# Patient Record
Sex: Male | Born: 1980 | Race: White | Hispanic: No | Marital: Married | Smoking: Never smoker
Health system: Southern US, Community
[De-identification: ages and names within clinical notes are randomized; demographics above are authoritative.]

## PROBLEM LIST (undated history)

## (undated) DIAGNOSIS — F419 Anxiety disorder, unspecified: Secondary | ICD-10-CM

## (undated) DIAGNOSIS — G4730 Sleep apnea, unspecified: Secondary | ICD-10-CM

## (undated) HISTORY — DX: Anxiety disorder, unspecified: F41.9

## (undated) HISTORY — DX: Morbid (severe) obesity due to excess calories: E66.01

## (undated) HISTORY — PX: VASECTOMY: SHX75

## (undated) HISTORY — PX: HERNIA REPAIR: SHX51

## (undated) HISTORY — DX: Sleep apnea, unspecified: G47.30

---

## 2007-03-16 ENCOUNTER — Ambulatory Visit: Payer: Self-pay

## 2007-05-30 ENCOUNTER — Emergency Department: Payer: Self-pay | Admitting: Emergency Medicine

## 2007-11-03 ENCOUNTER — Ambulatory Visit: Payer: Self-pay | Admitting: Family Medicine

## 2007-11-05 ENCOUNTER — Emergency Department: Payer: Self-pay | Admitting: Emergency Medicine

## 2009-03-20 DIAGNOSIS — Z800 Family history of malignant neoplasm of digestive organs: Secondary | ICD-10-CM | POA: Insufficient documentation

## 2010-05-23 DIAGNOSIS — F419 Anxiety disorder, unspecified: Secondary | ICD-10-CM | POA: Insufficient documentation

## 2012-03-11 ENCOUNTER — Ambulatory Visit: Payer: Self-pay | Admitting: Family Medicine

## 2012-03-11 LAB — RAPID INFLUENZA A&B ANTIGENS

## 2012-03-11 LAB — RAPID STREP-A WITH REFLX: Micro Text Report: POSITIVE

## 2014-04-22 DIAGNOSIS — I499 Cardiac arrhythmia, unspecified: Secondary | ICD-10-CM | POA: Insufficient documentation

## 2015-07-26 DIAGNOSIS — R6882 Decreased libido: Secondary | ICD-10-CM | POA: Insufficient documentation

## 2015-07-26 DIAGNOSIS — R002 Palpitations: Secondary | ICD-10-CM | POA: Insufficient documentation

## 2015-07-26 DIAGNOSIS — F458 Other somatoform disorders: Secondary | ICD-10-CM | POA: Insufficient documentation

## 2015-08-30 ENCOUNTER — Ambulatory Visit: Payer: Worker's Compensation

## 2015-08-30 ENCOUNTER — Ambulatory Visit
Admission: EM | Admit: 2015-08-30 | Discharge: 2015-08-30 | Disposition: A | Discharge status: Home / Self Care | Payer: Worker's Compensation | Attending: Family Medicine | Admitting: Family Medicine

## 2015-08-30 DIAGNOSIS — S8392XA Sprain of unspecified site of left knee, initial encounter: Secondary | ICD-10-CM | POA: Diagnosis not present

## 2015-08-30 DIAGNOSIS — M25462 Effusion, left knee: Secondary | ICD-10-CM | POA: Diagnosis not present

## 2015-08-30 MED ORDER — MELOXICAM 7.5 MG PO TABS: 7.5000 mg | ORAL_TABLET | Freq: Two times a day (BID) | ORAL | Status: DC

## 2015-08-30 NOTE — Discharge Instructions (Signed)
Knee sleeve 24/7 except off to bathe Non-weight-bearing- use crutches Ice and elevate after activity Meloxicam 7.5 mg 1 tablet twice daily-- DO NOT TAKE OTHER NSAIDs- may take tylenol at lunch if desired Return  for re-evaluation in one week   Knee Sprain A knee sprain is a tear in one of the strong, fibrous tissues that connect the bones (ligaments) in your knee. The severity of the sprain depends on how much of the ligament is torn. The tear can be either partial or complete. CAUSES  Often, sprains are a result of a fall or injury. The force of the impact causes the fibers of your ligament to stretch too much. This excess tension causes the fibers of your ligament to tear. SIGNS AND SYMPTOMS  You may have some loss of motion in your knee. Other symptoms include:  Bruising.  Pain in the knee area.  Tenderness of the knee to the touch.  Swelling. DIAGNOSIS  To diagnose a knee sprain, your health care provider will physically examine your knee. Your health care provider may also suggest an X-ray exam of your knee to make sure no bones are broken. TREATMENT  If your ligament is only partially torn, treatment usually involves keeping the knee in a fixed position (immobilization) or bracing your knee for activities that require movement for several weeks. To do this, your health care provider will apply a bandage, cast, or splint to keep your knee from moving and to support your knee during movement until it heals. For a partially torn ligament, the healing process usually takes 4-6 weeks. If your ligament is completely torn, depending on which ligament it is, you may need surgery to reconnect the ligament to the bone or reconstruct it. After surgery, a cast or splint may be applied and will need to stay on your knee for 4-6 weeks while your ligament heals. HOME CARE INSTRUCTIONS  Keep your injured knee elevated to decrease swelling.  To ease pain and swelling, apply ice to the injured  area:  Put ice in a plastic bag.  Place a towel between your skin and the bag.  Leave the ice on for 20 minutes, 2-3 times a day.  Only take medicine for pain as directed by your health care provider.  Do not leave your knee unprotected until pain and stiffness go away (usually 4-6 weeks).  If you have a cast or splint, do not allow it to get wet. If you have been instructed not to remove it, cover it with a plastic bag when you shower or bathe. Do not swim.  Your health care provider may suggest exercises for you to do during your recovery to prevent or limit permanent weakness and stiffness. SEEK IMMEDIATE MEDICAL CARE IF:  Your cast or splint becomes damaged.  Your pain becomes worse.  You have significant pain, swelling, or numbness below the cast or splint. MAKE SURE YOU:  Understand these instructions.  Will watch your condition.  Will get help right away if you are not doing well or get worse. Document Released: 12/02/2005 Document Revised: 09/22/2013 Document Reviewed: 07/14/2013 [facility] Patient Information 2015 [place], [place]. This information is not intended to replace advice given to you by your health care provider. Make sure you discuss any questions you have with your health care provider.

## 2015-08-30 NOTE — ED Notes (Signed)
Pt states "on August 30th I tweeked my knee at work, I thought it would get better but it continues to hurt."

## 2015-09-01 ENCOUNTER — Encounter: Payer: Self-pay | Admitting: Physician Assistant

## 2015-09-01 NOTE — ED Provider Notes (Signed)
CSN: [id number]     Arrival date & time 08/30/15  1300 History   First MD Initiated Contact with Patient 08/30/15 1515     Chief Complaint  Patient presents with  . Knee Injury  . 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  He lost his balance,  fell to the  left putting torque on his left knee. Denies previous history of injury to left knee.  Now with pain and swelling of the knee, using ice and ace wrap as advised by nurse at [facility]. Elevates in the evening. Has continued to work but pain is not getting any better.  Weights the leg but favors. Has used Ibuprofen up to 800 mg often repeated increased amounts quite frequently Has a Hot Tub at home at 85 degrees and using it daily.   History reviewed. No pertinent past medical history. Past Surgical History  Procedure Laterality Date  . Hernia repair      AT AGE 39   Family History  Problem Relation Age of Onset  . Liver cancer Mother   . Colon cancer Mother   . Diabetes Father   . Healthy Sister   . Asthma Brother   . Bipolar disorder Brother   . Colon cancer Maternal Grandfather   . Healthy Brother    Social History  Substance Use Topics  . Smoking status: Never Smoker   . Smokeless tobacco: None  . Alcohol Use: No    Review of Systems Constitutional -afebrile Eyes-denies visual changes ENT- normal voice,denies sore throat CV-denies chest pain Resp-denies SOB GI- negative for nausea,vomiting, diarrhea GU- negative for dysuria MSK- negative for back pain, ambulatory antalgic -see HPI Skin- denies acute changes Neuro- negative headache,focal weakness or numbness   Allergies  Review of patient's allergies indicates no known allergies.  Home Medications   Prior to Admission medications   Medication Sig Start  Date End Date Taking? Authorizing Provider  meloxicam (MOBIC) 7.5 MG tablet Take 1 tablet (7.5 mg total) by mouth 2 (two) times daily. 08/30/15   Rae Halsted, PA-C   Meds Ordered and Administered this Visit  Medications - No data to display  BP 136/83 mmHg  Pulse 73  Temp(Src) 98.2 F (36.8 C) (Oral)  Resp 16  Ht 6\' 1"  (1.854 m)  Wt 290 lb (131.543 kg)  BMI 38.27 kg/m2  SpO2 99% No data found.   Physical Exam Constitutional: Alert and oriented, well appearing, VS are noted,  73 "  290 lb General : some distress left knee pain; HEENT:  Head:normocephalic, atraumatic,                Eyes: conjugate gaze,negative conjunctiva     Ears:Bilateral canals and tympanic membranes WNL     Nose:normal,     Mouth/throat :Mucous membranes moist, No pharyngeal erythema,no exudate, no noted oral lesions Neck :  supple without thyromegaly Lymph: without enlargement Lung:   effort and breath sounds normal , no distress Heart:   normal rate,regular rhythm,  Back:    No CVAT, no spinal tenderness noted Abd :    soft, nontender, bowel sounds present, no guarding  MSK:    Wrapped in ace bandage, irregular-defer in future. Knee mildly swollen; Flex to 75/80 degrees approx but with discomfort at max.  He manages up /down table without assistance but with discomfort.  Large man with heavy legs making landmarks difficult. Patellar dimpling is not well defined but non-specific if defines effusion or body fat. Patella normally located and non-tender, no identifiable increased mobility. Popliteal space palpation does not elicit increased tenderness. Medial knee is most sensitive- Medial point  tenderness- cannot elicit laxity in joint-lateral knee also uncomfortable but less so. Full extension  uncomfortable- he complains of knee feeling tight and full. Cannot accomplish a seated 4 position with either his normal or injured leg. Supine, SLR neg to back- attempted crossover 4 yields tenderness/discomfort lateral  support. Patellar compression w ROM yields crepitus on the left but not on the right . He denies any knowedge of pre-existing. Neuro: CN ll-Xl as tested,grossly intact; normal gait, normal speech and language Skin:  Warm,dry,intact Psych: mood and affect WNL  ED Course  Procedures (including critical care time)  Labs Review Labs Reviewed - No data to display  Imaging Review No results found.  Films did not drop-no fractures or dislocations identified. No bony abnormality , small effusion.     MDM   1. Left knee sprain, initial encounter   2. Knee effusion, left     Plan: 1. Test/x-ray results and diagnosis reviewed with patient 2. Rx as per orders; will use Meloxicam 7.5mg  Rx  for ease of BID use- D/C if GERD symptoms. Risks, benefits, potential side effects reviewed with patient  Crutches for non-weight bearing until without pain, advance as tolerated  Work assignment modified to that appropriate to use of crutches  Ice paks ( frozen peas) at rest breaks and after work  An elastic sleeve with patella cut-out may be utilized if a big enough one can be located.-Snug fit to stop slippage- not overly tight. 3. Recommend supportive treatment with ice packs and open hip leg elevation- not at 90.Marland Kitchen   Hot tub may be useful for increased ROM activity but do not push to pain- do not stress the leg with use of ladder 4. Seek medical re-evaluation in one week.  Dr. Judd Gaudier has agreed to see in my absence.- return more quickly if symptoms worsen.   Rae Halsted, PA-C 09/01/15 [phone]

## 2015-09-06 ENCOUNTER — Ambulatory Visit
Admission: EM | Admit: 2015-09-06 | Discharge: 2015-09-06 | Disposition: A | Discharge status: Home / Self Care | Payer: Worker's Compensation | Attending: Family Medicine | Admitting: Family Medicine

## 2015-09-06 ENCOUNTER — Encounter: Payer: Self-pay | Admitting: Emergency Medicine

## 2015-09-06 DIAGNOSIS — S86912D Strain of unspecified muscle(s) and tendon(s) at lower leg level, left leg, subsequent encounter: Secondary | ICD-10-CM | POA: Diagnosis not present

## 2015-09-06 NOTE — ED Notes (Signed)
Pt for recheck of left knee

## 2015-09-06 NOTE — ED Provider Notes (Signed)
CSN: [id number]     Arrival date & time 09/06/15  1343 History   First MD Initiated Contact with Patient 09/06/15 1541     Chief Complaint  Patient presents with  . Knee Injury   (Consider location/radiation/quality/duration/timing/severity/associated sxs/prior Treatment) HPI   Is a 34 year old male who was seen last week in clinic for a sprain of the left knee. That time he had an effusion blocking some of his motion. States now that he is much improved swelling has subsided substantially early he is on light duty and would like to go return to full duty. He denies any locking popping or clicking. History reviewed. No pertinent past medical history. Past Surgical History  Procedure Laterality Date  . Hernia repair      AT AGE 35   Family History  Problem Relation Age of Onset  . Liver cancer Mother   . Colon cancer Mother   . Diabetes Father   . Healthy Sister   . Asthma Brother   . Bipolar disorder Brother   . Colon cancer Maternal Grandfather   . Healthy Brother    Social History  Substance Use Topics  . Smoking status: Never Smoker   . Smokeless tobacco: None  . Alcohol Use: No    Review of Systems  Constitutional: Negative for fatigue.  Musculoskeletal: Negative for gait problem.    Allergies  Review of patient's allergies indicates no known allergies.  Home Medications   Prior to Admission medications   Medication Sig Start Date End Date Taking? Authorizing Provider  meloxicam (MOBIC) 7.5 MG tablet Take 1 tablet (7.5 mg total) by mouth 2 (two) times daily. 08/30/15   Rae Halsted, PA-C   Meds Ordered and Administered this Visit  Medications - No data to display  BP 149/66 mmHg  Pulse 75  Temp(Src) 98.2 F (36.8 C) (Tympanic)  Resp 20  Ht  (1.854 m)  Wt 304 lb (137.893 kg)  BMI 40.12 kg/m2  SpO2 99% No data found.   Physical Exam  Constitutional: He is oriented to person, place, and time. He appears well-developed and well-nourished.  HENT:   Head: Normocephalic and atraumatic.  Eyes: Pupils are equal, round, and reactive to light.  Neck: Normal range of motion.  Musculoskeletal: He exhibits no edema or tenderness.  Examination of the left knee shows good quad control. Flexion extension are full. Is no effusion present. No joint line tenderness. Lateral ligaments are intact bilaterally. Is a negative anterior drawer sign. No Patellofemoral crepitus or apprehension present.  Neurological: He is alert and oriented to person, place, and time.  Skin: Skin is warm.  Psychiatric: He has a normal mood and affect. His behavior is normal. Judgment and thought content normal.  Nursing note and vitals reviewed.   ED Course  Procedures (including critical care time)  Labs Review Labs Reviewed - No data to display  Imaging Review No results found.   Visual Acuity Review  Right Eye Distance:   Left Eye Distance:   Bilateral Distance:    Right Eye Near:   Left Eye Near:    Bilateral Near:         MDM   1. Strain of left knee and leg, subsequent encounter    New Prescriptions   No medications on file  Plan: 1 Dagnosis reviewed with patient 2. rx as per orders; risks, benefits, potential side effects reviewed with patient 3. Recommend supportive treatment with rest PRN 4. F/u prn if symptoms worsen or  don't improve     Lutricia Feil, PA-C 09/06/15 1603

## 2015-12-21 ENCOUNTER — Encounter: Payer: Self-pay | Admitting: Family Medicine

## 2015-12-21 ENCOUNTER — Ambulatory Visit (INDEPENDENT_AMBULATORY_CARE_PROVIDER_SITE_OTHER): Payer: BLUE CROSS/BLUE SHIELD | Admitting: Family Medicine

## 2015-12-21 VITALS — BP 106/84 | HR 110 | Temp 98.5°F | Resp 16 | Wt 303.4 lb

## 2015-12-21 DIAGNOSIS — K529 Noninfective gastroenteritis and colitis, unspecified: Secondary | ICD-10-CM

## 2015-12-21 MED ORDER — ONDANSETRON 8 MG PO TBDP: 8.0000 mg | ORAL_TABLET | Freq: Three times a day (TID) | ORAL | Status: DC | PRN

## 2015-12-21 NOTE — Progress Notes (Signed)
Subjective:     Patient ID: Stanley Davis, male   DOB: 30-Oct-1981, 35 y.o.   MRN: [id number]  HPI  Chief Complaint  Patient presents with  . Emesis    States he started vomiting about 1 hour after eating supper at home last night.   States he last vomited at 0400 today and now has profuse watery stools. States his wife and young children are not ill.   Review of Systems  Constitutional: Positive for chills. Negative for fever.       Objective:   Physical Exam  Constitutional: He appears well-developed and well-nourished. No distress.  Abdominal: Soft. Bowel sounds are normal. There is tenderness (mild in epigastric and right lower quadrant).       Assessment:    1. Gastroenteritis - ondansetron (ZOFRAN ODT) 8 MG disintegrating tablet; Take 1 tablet (8 mg total) by mouth every 8 (eight) hours as needed for nausea or vomiting.  Dispense: 20 tablet; Refill: 0    Plan:    Discussed use of imodium and starting p.o. Intake. Monitor RLQ tenderness.

## 2015-12-21 NOTE — Patient Instructions (Signed)
May use imodium after the first 24 hours of diarrhea. Keep up with fluids (especially Gatorade) and eat as tolerated. If your lower abdominal pain gets worse let me know.

## 2016-04-02 ENCOUNTER — Ambulatory Visit
Admission: EM | Admit: 2016-04-02 | Discharge: 2016-04-02 | Disposition: A | Discharge status: Home / Self Care | Payer: BLUE CROSS/BLUE SHIELD | Attending: Family Medicine | Admitting: Family Medicine

## 2016-04-02 ENCOUNTER — Ambulatory Visit (INDEPENDENT_AMBULATORY_CARE_PROVIDER_SITE_OTHER)
Admit: 2016-04-02 | Discharge: 2016-04-02 | Disposition: A | Discharge status: Home / Self Care | Payer: BLUE CROSS/BLUE SHIELD | Attending: Family Medicine | Admitting: Family Medicine

## 2016-04-02 DIAGNOSIS — K439 Ventral hernia without obstruction or gangrene: Secondary | ICD-10-CM | POA: Diagnosis not present

## 2016-04-02 DIAGNOSIS — R1031 Right lower quadrant pain: Secondary | ICD-10-CM | POA: Diagnosis not present

## 2016-04-02 LAB — URINALYSIS COMPLETE WITH MICROSCOPIC (ARMC ONLY)
BILIRUBIN URINE: NEGATIVE
Bacteria, UA: NONE SEEN
Glucose, UA: NEGATIVE mg/dL
Hgb urine dipstick: NEGATIVE
KETONES UR: NEGATIVE mg/dL
Leukocytes, UA: NEGATIVE
NITRITE: NEGATIVE
Protein, ur: NEGATIVE mg/dL
SPECIFIC GRAVITY, URINE: 1.025 (ref 1.005–1.030)
pH: 6 (ref 5.0–8.0)

## 2016-04-02 LAB — CBC WITH DIFFERENTIAL/PLATELET
Basophils Absolute: 0 10*3/uL (ref 0–0.1)
Basophils Relative: 0 %
EOS ABS: 0.1 10*3/uL (ref 0–0.7)
EOS PCT: 2 %
HCT: 50.2 % (ref 40.0–52.0)
HEMOGLOBIN: 17 g/dL (ref 13.0–18.0)
LYMPHS ABS: 2.6 10*3/uL (ref 1.0–3.6)
Lymphocytes Relative: 34 %
MCH: 29.2 pg (ref 26.0–34.0)
MCHC: 33.8 g/dL (ref 32.0–36.0)
MCV: 86.4 fL (ref 80.0–100.0)
MONO ABS: 0.6 10*3/uL (ref 0.2–1.0)
MONOS PCT: 8 %
Neutro Abs: 4.4 10*3/uL (ref 1.4–6.5)
Neutrophils Relative %: 56 %
PLATELETS: 216 10*3/uL (ref 150–440)
RBC: 5.81 MIL/uL (ref 4.40–5.90)
RDW: 13.2 % (ref 11.5–14.5)
WBC: 7.8 10*3/uL (ref 3.8–10.6)

## 2016-04-02 LAB — COMPREHENSIVE METABOLIC PANEL
ALK PHOS: 73 U/L (ref 38–126)
ALT: 76 U/L — ABNORMAL HIGH (ref 17–63)
ANION GAP: 5 (ref 5–15)
AST: 49 U/L — ABNORMAL HIGH (ref 15–41)
Albumin: 4.4 g/dL (ref 3.5–5.0)
BUN: 17 mg/dL (ref 6–20)
CALCIUM: 9.1 mg/dL (ref 8.9–10.3)
CO2: 24 mmol/L (ref 22–32)
Chloride: 108 mmol/L (ref 101–111)
Creatinine, Ser: 0.96 mg/dL (ref 0.61–1.24)
GFR calc non Af Amer: >60 mL/min (ref >60)
Glucose, Bld: 113 mg/dL — ABNORMAL HIGH (ref 65–99)
Potassium: 4.2 mmol/L (ref 3.5–5.1)
SODIUM: 137 mmol/L (ref 135–145)
Total Bilirubin: 0.7 mg/dL (ref 0.3–1.2)
Total Protein: 7.7 g/dL (ref 6.5–8.1)

## 2016-04-02 LAB — OCCULT BLOOD X 1 CARD TO LAB, STOOL: FECAL OCCULT BLD: NEGATIVE

## 2016-04-02 LAB — LIPASE, BLOOD: Lipase: 27 U/L (ref 11–51)

## 2016-04-02 LAB — AMYLASE: AMYLASE: 44 U/L (ref 28–100)

## 2016-04-02 MED ORDER — IOHEXOL 350 MG/ML SOLN
150.0000 mL | Freq: Once | INTRAVENOUS | Status: AC | PRN
  Administered 2016-04-02: 100 mL via INTRAVENOUS

## 2016-04-02 NOTE — ED Provider Notes (Signed)
CSN: [id number]     Arrival date & time 04/02/16  0813 History   First MD Initiated Contact with Patient 04/02/16 [phone]    Nurses notes were reviewed.  Chief Complaint  Patient presents with  . Abdominal Pain    Right sided abd pain started yesterday. Describes as "annoying", comes and goes and worse with movement. No n/v. Some diarrhea. Pain 5/10 at its worst   (Consider location/radiation/quality/duration/timing/severity/associated sxs/prior Treatment) HPI  Past Medical History  Diagnosis Date  . Mild sleep apnea    Past Surgical History  Procedure Laterality Date  . Hernia repair      AT AGE 74   Family History  Problem Relation Age of Onset  . Liver cancer Mother   . Colon cancer Mother   . Diabetes Father   . Healthy Sister   . Asthma Brother   . Bipolar disorder Brother   . Colon cancer Maternal Grandfather   . Healthy Brother    Social History  Substance Use Topics  . Smoking status: Never Smoker   . Smokeless tobacco: None  . Alcohol Use: No    Review of Systems  Allergies  Review of patient's allergies indicates no known allergies.  Home Medications   Prior to Admission medications   Medication Sig Start Date End Date Taking? Authorizing Provider  ondansetron (ZOFRAN ODT) 8 MG disintegrating tablet Take 1 tablet (8 mg total) by mouth every 8 (eight) hours as needed for nausea or vomiting. 12/21/15   Anola Gurney, PA   Meds Ordered and Administered this Visit  Medications - No data to display  BP 144/89 mmHg  Pulse 80  Temp(Src) 98.2 F (36.8 C) (Oral)  Resp 18  Ht  (1.854 m)  Wt 285 lb (129.275 kg)  BMI 37.61 kg/m2  SpO2 97% No data found.   Physical Exam  Constitutional: He appears well-developed and well-nourished. No distress.  HENT:  Head: Normocephalic and atraumatic.  Eyes: Pupils are equal, round, and reactive to light.  Neck: Normal range of motion. Neck supple.  Cardiovascular: Normal rate, regular rhythm and normal heart  sounds.   Pulmonary/Chest: Effort normal and breath sounds normal. No respiratory distress.  Abdominal: Soft. Bowel sounds are normal. He exhibits no distension and no mass. There is no hepatosplenomegaly. There is tenderness. There is no rebound and no CVA tenderness. No hernia. Hernia confirmed negative in the ventral area.    Genitourinary: Prostate normal. Rectal exam shows tenderness. Rectal exam shows no fissure. Guaiac negative stool.  Right lower tenderness present on rectal examination.  Musculoskeletal: Normal range of motion.  Neurological: He is alert.  Skin: Skin is warm and dry.  Psychiatric: He has a normal mood and affect.  Vitals reviewed.   ED Course  Procedures (including critical care time)  Labs Review Labs Reviewed  URINALYSIS COMPLETEWITH MICROSCOPIC ([facility] ONLY) - Abnormal; Notable for the following:    Squamous Epithelial / LPF 0-5 (*)    All other components within normal limits  COMPREHENSIVE METABOLIC PANEL - Abnormal; Notable for the following:    Glucose, Bld 113 (*)    AST 49 (*)    ALT 76 (*)    All other components within normal limits  CBC WITH DIFFERENTIAL/PLATELET  LIPASE, BLOOD  AMYLASE  OCCULT BLOOD X 1 CARD TO LAB, STOOL    Imaging Review Ct Abdomen Pelvis W Contrast  04/02/2016  CLINICAL DATA:  Right mid and lower quadrant pain for 1 day EXAM: CT ABDOMEN AND PELVIS  WITH CONTRAST TECHNIQUE: Multidetector CT imaging of the abdomen and pelvis was performed using the standard protocol following bolus administration of intravenous contrast. Oral contrast was also administered. CONTRAST:  100mL OMNIPAQUE IOHEXOL 350 MG/ML SOLN COMPARISON:  None. FINDINGS: Lower chest:  Lung bases are clear. Hepatobiliary: There is a degree of hepatic steatosis. No focal liver lesions are identified. The gallbladder wall is not appreciably thickened. There is no biliary duct dilatation. Pancreas: There is no pancreatic mass or inflammatory focus. Spleen: No splenic  lesions are evident. Adrenals/Urinary Tract: Adrenals appear normal bilaterally. Kidneys bilaterally show no mass or hydronephrosis on either side. There is no renal or ureteral calculus on either side. The urinary bladder is midline with wall thickness within normal limits. Stomach/Bowel: There is no bowel wall or mesenteric thickening. No bowel obstruction. No free air or portal venous air. Vascular/Lymphatic: There is no abdominal aortic aneurysm. No vascular lesions are evident. There is no demonstrable adenopathy in the abdomen or pelvis. Reproductive: Prostate and seminal vesicles appear normal. There is no pelvic mass or pelvic fluid collection. Other: Appendix appears normal. No ascites or abscess is evident in the abdomen or pelvis. There is a minimal ventral hernia containing only fat. Musculoskeletal: There are no blastic or lytic bone lesions. There is no intramuscular or abdominal wall lesion. IMPRESSION: No bowel obstruction.  No abscess.  Appendix appears normal. No renal or ureteral calculus.  No hydronephrosis. There is a degree of hepatic steatosis. There is a minimal ventral hernia containing only fat. Electronically Signed   By: Bretta BangWilliam  Woodruff III M.D.   On: 04/02/2016 11:47     Visual Acuity Review  Right Eye Distance:   Left Eye Distance:   Bilateral Distance:    Right Eye Near:   Left Eye Near:    Bilateral Near:     Results for orders placed or performed during the hospital encounter of 04/02/16  Urinalysis complete, with microscopic  Result Value Ref Range   Color, Urine YELLOW YELLOW   APPearance CLEAR CLEAR   Glucose, UA NEGATIVE NEGATIVE mg/dL   Bilirubin Urine NEGATIVE NEGATIVE   Ketones, ur NEGATIVE NEGATIVE mg/dL   Specific Gravity, Urine 1.025 1.005 - 1.030   Hgb urine dipstick NEGATIVE NEGATIVE   pH 6.0 5.0 - 8.0   Protein, ur NEGATIVE NEGATIVE mg/dL   Nitrite NEGATIVE NEGATIVE   Leukocytes, UA NEGATIVE NEGATIVE   RBC / HPF 0-5 0 - 5 RBC/hpf   WBC, UA  0-5 0 - 5 WBC/hpf   Bacteria, UA NONE SEEN NONE SEEN   Squamous Epithelial / LPF 0-5 (A) NONE SEEN   Mucous PRESENT   CBC with Differential  Result Value Ref Range   WBC 7.8 3.8 - 10.6 K/uL   RBC 5.81 4.40 - 5.90 MIL/uL   Hemoglobin 17.0 13.0 - 18.0 g/dL   HCT 43.350.2 29.540.0 - 18.852.0 %   MCV 86.4 80.0 - 100.0 fL   MCH 29.2 26.0 - 34.0 pg   MCHC 33.8 32.0 - 36.0 g/dL   RDW 41.613.2 60.611.5 - 30.114.5 %   Platelets 216 150 - 440 K/uL   Neutrophils Relative % 56 %   Neutro Abs 4.4 1.4 - 6.5 K/uL   Lymphocytes Relative 34 %   Lymphs Abs 2.6 1.0 - 3.6 K/uL   Monocytes Relative 8 %   Monocytes Absolute 0.6 0.2 - 1.0 K/uL   Eosinophils Relative 2 %   Eosinophils Absolute 0.1 0 - 0.7 K/uL   Basophils Relative 0 %  Basophils Absolute 0.0 0 - 0.1 K/uL  Comprehensive metabolic panel  Result Value Ref Range   Sodium 137 135 - 145 mmol/L   Potassium 4.2 3.5 - 5.1 mmol/L   Chloride 108 101 - 111 mmol/L   CO2 24 22 - 32 mmol/L   Glucose, Bld 113 (H) 65 - 99 mg/dL   BUN 17 6 - 20 mg/dL   Creatinine, Ser 1.61 0.61 - 1.24 mg/dL   Calcium 9.1 8.9 - 09.6 mg/dL   Total Protein 7.7 6.5 - 8.1 g/dL   Albumin 4.4 3.5 - 5.0 g/dL   AST 49 (H) 15 - 41 U/L   ALT 76 (H) 17 - 63 U/L   Alkaline Phosphatase 73 38 - 126 U/L   Total Bilirubin 0.7 0.3 - 1.2 mg/dL   GFR calc non Af Amer >60 >60 mL/min   GFR calc Af Amer >60 >60 mL/min   Anion gap 5 5 - 15  Lipase, blood  Result Value Ref Range   Lipase 27 11 - 51 U/L  Amylase  Result Value Ref Range   Amylase 44 28 - 100 U/L  Occult blood card to lab, stool Provider will collect  Result Value Ref Range   Fecal Occult Bld NEGATIVE NEGATIVE      MDM   1. Abdominal pain, RLQ (right lower quadrant)   2. Ventral hernia without obstruction or gangrene    There is no clear-cut etiology for the abdominal pain. He has a ventral hernia with some fat present but no signs of incarceration of any GI organs. Will have him follow-up with his PCP if he continues to have  pain and will give him a note for work for today as well.  Note: This dictation was prepared with Dragon dictation along with smaller phrase technology. Any transcriptional errors that result from this process are unintentional.  Hassan Rowan, MD 04/02/16 1259

## 2016-04-02 NOTE — ED Notes (Signed)
This nurse called for prior authorization of CT.  Per Kipp BroodBrent with [employer] patients Insurance Plan does not required prior authorization for CT.

## 2016-04-02 NOTE — Discharge Instructions (Signed)
Abdominal Pain, Adult °Many things can cause belly (abdominal) pain. Most times, the belly pain is not dangerous. Many cases of belly pain can be watched and treated at home. °HOME CARE  °· Do not take medicines that help you go poop (laxatives) unless told to by your doctor. °· Only take medicine as told by your doctor. °· Eat or drink as told by your doctor. Your doctor will tell you if you should be on a special diet. °GET HELP IF: °· You do not know what is causing your belly pain. °· You have belly pain while you are sick to your stomach (nauseous) or have runny poop (diarrhea). °· You have pain while you pee or poop. °· Your belly pain wakes you up at night. °· You have belly pain that gets worse or better when you eat. °· You have belly pain that gets worse when you eat fatty foods. °· You have a fever. °GET HELP RIGHT AWAY IF:  °· The pain does not go away within 2 hours. °· You keep throwing up (vomiting). °· The pain changes and is only in the right or left part of the belly. °· You have bloody or tarry looking poop. °MAKE SURE YOU:  °· Understand these instructions. °· Will watch your condition. °· Will get help right away if you are not doing well or get worse. °  °This information is not intended to replace advice given to you by your health care provider. Make sure you discuss any questions you have with your health care provider. °  °Document Released: 05/20/2008 Document Revised: 12/23/2014 Document Reviewed: 08/11/2013 °Elsevier Interactive Patient Education ©2016 Elsevier Inc. ° °Pain Without a Known Cause °WHAT IS PAIN WITHOUT A KNOWN CAUSE? °Pain can occur in any part of the body and can range from mild to severe. Sometimes no cause can be found for why you are having pain. Some types of pain that can occur without a known cause include:  °· Headache. °· Back pain. °· Abdominal pain. °· Neck pain. °HOW IS PAIN WITHOUT A KNOWN CAUSE DIAGNOSED?  °Your health care provider will try to find the cause  of your pain. This may include: °· Physical exam. °· Medical history. °· Blood tests. °· Urine tests. °· X-rays. °If no cause is found, your health care provider may diagnose you with pain without a known cause.  °IS THERE TREATMENT FOR PAIN WITHOUT A CAUSE?  °Treatment depends on the kind of pain you have. Your health care provider may prescribe medicines to help relieve your pain.  °WHAT CAN I DO AT HOME FOR MY PAIN?  °· Take medicines only as directed by your health care provider. °· Stop any activities that cause pain. During periods of severe pain, bed rest may help. °· Try to reduce your stress with activities such as yoga or meditation. Talk to your health care provider for other stress-reducing activity recommendations. °· Exercise regularly, if approved by your health care provider. °· Eat a healthy diet that includes fruits and vegetables. This may improve pain. Talk to your health care provider if you have any questions about your diet. °WHAT IF MY PAIN DOES NOT GET BETTER?  °If you have a painful condition and no reason can be found for the pain or the pain gets worse, it is important to follow up with your health care provider. It may be necessary to repeat tests and look further for a possible cause.  °  °This information is not   intended to replace advice given to you by your health care provider. Make sure you discuss any questions you have with your health care provider.   Document Released: 08/27/2001 Document Revised: 12/23/2014 Document Reviewed: 04/19/2014 Elsevier Interactive Patient Education 2016 Elsevier Inc.  Ventral Hernia A ventral hernia (also called an incisional hernia) is a hernia that occurs at the site of a previous surgical cut (incision) in the abdomen. The abdominal wall spans from your lower chest down to your pelvis. If the abdominal wall is weakened from a surgical incision, a hernia can occur. A hernia is a bulge of bowel or muscle tissue pushing out on the weakened  part of the abdominal wall. Ventral hernias can get bigger from straining or lifting. Obese and older people are at higher risk for a ventral hernia. People who develop infections after surgery or require repeat incisions at the same site on the abdomen are also at increased risk. CAUSES  A ventral hernia occurs because of weakness in the abdominal wall at an incision site.  SYMPTOMS  Common symptoms include:  A visible bulge or lump on the abdominal wall.  Pain or tenderness around the lump.  Increased discomfort if you cough or make a sudden movement. If the hernia has blocked part of the intestine, a serious complication can occur (incarcerated or strangulated hernia). This can become a problem that requires emergency surgery because the blood flow to the blocked intestine may be cut off. Symptoms may include:  Feeling sick to your stomach (nauseous).  Throwing up (vomiting).  Stomach swelling (distention) or bloating.  Fever.  Rapid heartbeat. DIAGNOSIS  Your health care provider will take a medical history and perform a physical exam. Various tests may be ordered, such as:  Blood tests.  Urine tests.  Ultrasonography.  X-rays.  Computed tomography (CT). TREATMENT  Watchful waiting may be all that is needed for a smaller hernia that does not cause symptoms. Your health care provider may recommend the use of a supportive belt (truss) that helps to keep the abdominal wall intact. For larger hernias or those that cause pain, surgery to repair the hernia is usually recommended. If a hernia becomes strangulated, emergency surgery needs to be done right away. HOME CARE INSTRUCTIONS  Avoid putting pressure or strain on the abdominal area.  Avoid heavy lifting.  Use good body positioning for physical tasks. Ask your health care provider about proper body positioning.  Use a supportive belt as directed by your health care provider.  Maintain a healthy weight.  Eat foods  that are high in fiber, such as whole grains, fruits, and vegetables. Fiber helps prevent difficult bowel movements (constipation).  Drink enough fluids to keep your urine clear or pale yellow.  Follow up with your health care provider as directed. SEEK MEDICAL CARE IF:   Your hernia seems to be getting larger or more painful. SEEK IMMEDIATE MEDICAL CARE IF:   You have abdominal pain that is sudden and sharp.  Your pain becomes severe.  You have repeated vomiting.  You are sweating a lot.  You notice a rapid heartbeat.  You develop a fever. MAKE SURE YOU:   Understand these instructions.  Will watch your condition.  Will get help right away if you are not doing well or get worse.   This information is not intended to replace advice given to you by your health care provider. Make sure you discuss any questions you have with your health care provider.   Document Released:  11/18/2012 Document Revised: 12/23/2014 Document Reviewed: 11/18/2012 Elsevier Interactive Patient Education [employer] Elsevier Inc.

## 2016-04-03 ENCOUNTER — Telehealth: Payer: Self-pay | Admitting: Emergency Medicine

## 2016-04-03 NOTE — ED Notes (Signed)
Patient seen here on 04/02/16 for abdominal pain. Patient called regarding visit and wanted to know about how long it would be before he started feeling better.  After reading through the AVS this nurse reviewed Dr. Hedy CamaraWades notes with patient and advised patient  that his notes did not indicate a "time frame" when patients abdominal pain would get better but that patient should follow up with his PCP in teh next week for further evaluation.

## 2016-04-03 NOTE — ED Notes (Signed)
Patient seen here on 04/02/16 for abdominal pain. Patient called regarding visit and wanted to know about how long it would be before he started feeling better.  After reading through the AVS this nurse reviewed Dr. Wades notes with patient and advised patient  that his notes did not indicate a "time frame" when patients abdominal pain would get better but that patient should follow up with his PCP in teh next week for further evaluation.  

## 2017-03-13 ENCOUNTER — Encounter: Payer: Self-pay | Admitting: Physician Assistant

## 2017-03-13 ENCOUNTER — Ambulatory Visit (INDEPENDENT_AMBULATORY_CARE_PROVIDER_SITE_OTHER): Payer: BLUE CROSS/BLUE SHIELD | Admitting: Physician Assistant

## 2017-03-13 VITALS — BP 126/78 | HR 96 | Temp 99.1°F | Resp 16 | Wt 294.0 lb

## 2017-03-13 DIAGNOSIS — A084 Viral intestinal infection, unspecified: Secondary | ICD-10-CM

## 2017-03-13 DIAGNOSIS — M25562 Pain in left knee: Secondary | ICD-10-CM | POA: Diagnosis not present

## 2017-03-13 MED ORDER — MELOXICAM 7.5 MG PO TABS: 7.5000 mg | ORAL_TABLET | Freq: Every day | ORAL | 0 refills | Status: AC

## 2017-03-13 NOTE — Patient Instructions (Signed)

## 2017-03-13 NOTE — Progress Notes (Signed)
Patient: Stanley Davis Male    DOB: Jan 19, 1981   36 y.o.   MRN: [id number] Visit Date: 03/14/2017  Today's Provider: Trey Sailors, PA-C   Chief Complaint  Patient presents with  . Diarrhea  . Knee Pain   Subjective:    Diarrhea   This is a new problem. The current episode started in the past 7 days. The problem occurs more than 10 times per day. The problem has been unchanged. The patient states that diarrhea does not awaken him from sleep. Associated symptoms include abdominal pain (Pt reports having abdominal cramping yesterday, but it has improved. ), a fever (His temp was as high as 102), myalgias and sweats. Pertinent negatives include no bloating, chills, coughing, headaches or vomiting. Nothing aggravates the symptoms. He has tried increased fluids for the symptoms. The treatment provided no relief.  Knee Pain   The incident occurred at work. The pain is present in the left knee. The quality of the pain is described as aching and stabbing. The pain is at a severity of 8/10. The pain has been constant since onset. Associated symptoms include a loss of motion. Pertinent negatives include no inability to bear weight, loss of sensation, muscle weakness, numbness or tingling. He reports no foreign bodies present. The symptoms are aggravated by movement and weight bearing. He has tried NSAIDs and elevation for the symptoms. The treatment provided no relief.   Patient has been exposed to his sick 1 mo old with similar GI symptoms. Nonbloody diarrhea. Feeling better today, no nausea but still diarrhea.  Patient also reports that he was backing up at work 4 days ago, got left foot caught in hole, fell backwards and hyperextended his knee. Pain afterwards aggravated by movement, inability to extend knee fully.  No Known Allergies   Current Outpatient Prescriptions:  .  meloxicam (MOBIC) 7.5 MG tablet, Take 1 tablet (7.5 mg total) by mouth daily., Disp: 14 tablet, Rfl: 0  Review  of Systems  Constitutional: Positive for fatigue and fever (His temp was as high as 102). Negative for activity change, appetite change, chills, diaphoresis and unexpected weight change.  Respiratory: Negative.  Negative for cough.   Gastrointestinal: Positive for abdominal pain (Pt reports having abdominal cramping yesterday, but it has improved. ) and diarrhea. Negative for abdominal distention, anal bleeding, bloating, blood in stool, constipation, nausea, rectal pain and vomiting.  Musculoskeletal: Positive for myalgias.  Neurological: Positive for light-headedness. Negative for dizziness, tingling, numbness and headaches.    Social History  Substance Use Topics  . Smoking status: Never Smoker  . Smokeless tobacco: Never Used  . Alcohol use No   Objective:   BP 126/78 (BP Location: Left Arm, Patient Position: Sitting, Cuff Size: Large)   Pulse 96   Temp 99.1 F (37.3 C)   Resp 16   Wt 294 lb (133.4 kg)   BMI 38.79 kg/m  Vitals:   03/13/17 1612  BP: 126/78  Pulse: 96  Resp: 16  Temp: 99.1 F (37.3 C)  Weight: 294 lb (133.4 kg)     Physical Exam  Constitutional: He appears well-developed and well-nourished. He does not appear ill. No distress.  HENT:  Mouth/Throat: Mucous membranes are normal.  Cardiovascular: Normal rate, regular rhythm and normal heart sounds.   Pulmonary/Chest: Effort normal and breath sounds normal. No respiratory distress. He has no wheezes. He has no rales.  Abdominal: Soft. He exhibits no distension and no mass. Bowel sounds are increased.  There is no tenderness. There is no rebound and no guarding.  Musculoskeletal: Normal range of motion. He exhibits edema.       Right knee: Normal.       Left knee: He exhibits swelling. He exhibits normal range of motion, no effusion, no ecchymosis, no deformity, no laceration, no erythema, normal alignment, no LCL laxity, normal patellar mobility, no bony tenderness, normal meniscus and no MCL laxity.  Tenderness found. No medial joint line, no lateral joint line, no MCL, no LCL and no patellar tendon tenderness noted.  Strength 5/5 bilateral lower extremities. Negative anterior/posterior drawer tests. Neg McMurray. No MCL or LCL laxity though there is some pain elicited with varus stress on left knee.   Skin: Skin is warm and dry.  Good turgor        Assessment & Plan:     1. Viral gastroenteritis  No longer nauseas, but with persistent diarrhea. May use immodium if diarrhea remains non-bloody. Counseled on rehydration and return precautions.  2. Acute pain of left knee  Suspect soft tissue injury, possibly LCL. No joint instability. Advised on RICE followed by activity as tolerated.  Pain relief as below.  - meloxicam (MOBIC) 7.5 MG tablet; Take 1 tablet (7.5 mg total) by mouth daily.  Dispense: 14 tablet; Refill: 0  Return if symptoms worsen or fail to improve.  The entirety of the information documented in the History of Present Illness, Review of Systems and Physical Exam were personally obtained by me. Portions of this information were initially documented by Kavin LeechLaura Walsh, CMA and reviewed by me for thoroughness and accuracy.         Trey SailorsAdriana M Pollak, PA-C  [facility]

## 2017-05-01 ENCOUNTER — Ambulatory Visit (INDEPENDENT_AMBULATORY_CARE_PROVIDER_SITE_OTHER): Payer: BLUE CROSS/BLUE SHIELD | Admitting: Urology

## 2017-05-01 ENCOUNTER — Encounter: Payer: Self-pay | Admitting: Urology

## 2017-05-01 VITALS — BP 147/95 | HR 82 | Ht 73.0 in | Wt 285.0 lb

## 2017-05-01 DIAGNOSIS — Z3009 Encounter for other general counseling and advice on contraception: Secondary | ICD-10-CM

## 2017-05-01 MED ORDER — DIAZEPAM 10 MG PO TABS: 10.0000 mg | ORAL_TABLET | Freq: Once | ORAL | 0 refills | Status: AC

## 2017-05-01 NOTE — Progress Notes (Signed)
05/01/2017 4:32 PM   Stanley Davis Apr 02, 1981 [id number]  Referring provider: Tamsen Roers, PA [address] [place], [place] [address]  Chief Complaint  Patient presents with  . VAS Consult    HPI: The patient is a 36 year old gentleman presents today to discuss a vasectomy. The patient and his wife have 2 children. They desire no further children. He did have bilateral inguinal hernia repairs as a child. He has no other genitourinary problems. He voids well without difficulty. He denies hematuria or nephrolithiasis.     PMH: Past Medical History:  Diagnosis Date  . Anxiety   . Mild sleep apnea     Surgical History: Past Surgical History:  Procedure Laterality Date  . HERNIA REPAIR     AT AGE 83    Home Medications:  Allergies as of 05/01/2017   No Known Allergies     Medication List       Accurate as of 05/01/17  4:32 PM. Always use your most recent med list.          diazepam 10 MG tablet Commonly known as:  VALIUM Take 1 tablet (10 mg total) by mouth once.       Allergies: No Known Allergies  Family History: Family History  Problem Relation Age of Onset  . Liver cancer Mother   . Colon cancer Mother   . Healthy Sister   . Asthma Brother   . Bipolar disorder Brother   . Healthy Brother   . Diabetes Father   . Colon cancer Maternal Grandfather     Social History:  reports that he has never smoked. He has never used smokeless tobacco. He reports that he does not drink alcohol or use drugs.  ROS: UROLOGY Frequent Urination?: No Hard to postpone urination?: No Burning/pain with urination?: No Get up at night to urinate?: Yes Leakage of urine?: Yes Urine stream starts and stops?: No Trouble starting stream?: No Do you have to strain to urinate?: No Blood in urine?: No Urinary tract infection?: No Sexually transmitted disease?: No Injury to kidneys or bladder?: No Painful intercourse?: No Weak stream?: No Erection problems?:  No Penile pain?: No  Gastrointestinal Nausea?: No Vomiting?: No Indigestion/heartburn?: No Diarrhea?: Yes Constipation?: No  Constitutional Fever: No Night sweats?: No Weight loss?: No Fatigue?: No  Skin Skin rash/lesions?: No Itching?: No  Eyes Blurred vision?: No Double vision?: No  Ears/Nose/Throat Sore throat?: No Sinus problems?: No  Hematologic/Lymphatic Swollen glands?: No Easy bruising?: No  Cardiovascular Leg swelling?: No Chest pain?: No  Respiratory Cough?: No Shortness of breath?: No  Endocrine Excessive thirst?: No  Musculoskeletal Back pain?: No Joint pain?: No  Neurological Headaches?: No Dizziness?: No  Psychologic Depression?: No Anxiety?: No  Physical Exam: BP (!) 147/95   Pulse 82   Ht 6\' 1"  (1.854 m)   Wt 285 lb (129.3 kg)   BMI 37.60 kg/m   Constitutional:  Alert and oriented, No acute distress. HEENT: [place] AT, moist mucus membranes.  Trachea midline, no masses. Cardiovascular: No clubbing, cyanosis, or edema. Respiratory: Normal respiratory effort, no increased work of breathing. GI: Abdomen is soft, nontender, nondistended, no abdominal masses GU: No CVA tenderness. Normal phallus. Testicles descended bilaterally. Benign no masses. Phallus palpable bilaterally. Skin: No rashes, bruises or suspicious lesions. Lymph: No cervical or inguinal adenopathy. Neurologic: Grossly intact, no focal deficits, moving all 4 extremities. Psychiatric: Normal mood and affect.  Laboratory Data: Lab Results  Component Value Date   WBC 7.8 04/02/2016  HGB 17.0 04/02/2016   HCT 50.2 04/02/2016   MCV 86.4 04/02/2016   PLT 216 04/02/2016    Lab Results  Component Value Date   CREATININE 0.96 04/02/2016    No results found for: PSA  No results found for: TESTOSTERONE  No results found for: HGBA1C  Urinalysis    Component Value Date/Time   COLORURINE YELLOW 04/02/2016 0828   APPEARANCEUR CLEAR 04/02/2016 0828   LABSPEC  1.025 04/02/2016 0828   PHURINE 6.0 04/02/2016 0828   GLUCOSEU NEGATIVE 04/02/2016 0828   HGBUR NEGATIVE 04/02/2016 0828   BILIRUBINUR NEGATIVE 04/02/2016 0828   KETONESUR NEGATIVE 04/02/2016 0828   PROTEINUR NEGATIVE 04/02/2016 0828   NITRITE NEGATIVE 04/02/2016 0828   LEUKOCYTESUR NEGATIVE 04/02/2016 0828      Assessment & Plan:    Today, we discussed what the vas deferens is, where it is located, and its function. We reviewed the procedure for vasectomy, it's risks, benefits, alternatives, and likelihood of achieving his goals. We discussed in detail the procedure, complications, and recovery as well as the need for clearance prior to unprotected intercourse. We discussed that vasectomy does not protect against sexually transmitted diseases. We discussed that this procedure does not result in immediate sterility and that they would need to use other forms of birth control until he has been cleared with negative postvasectomy semen analyses. I explained that the procedure is considered to be permanent and that attempts at reversal have varying degrees of success. These options include vasectomy reversal, sperm retrieval, and in vitro fertilization; these can be very expensive. We discussed the chance of postvasectomy pain syndrome which occurs in less than 5% of patients. I explained to the patient that there is no treatment to resolve this chronic pain, and that if it developed I would not be able to help resolve the issue, but that surgery is generally not needed for correction. I explained there have even been reports of systemic like illness associated with this chronic pain, and that there was no good cure. I explained that vasectomy it is not a 100% reliable form of birth control, and the risk of pregnancy after vasectomy is approximately 1 in 2000 men who had a negative postvasectomy semen analysis or rare non-motile sperm. I explained that repeat vasectomy was necessary in less than 1% of  vasectomy procedures when employing the type of technique that I use. I explained that he should refrain from ejaculation for approximately one week following vasectomy. I explained that there are other options for birth control which are permanent and non-permanent; we discussed these. I explained the rates of surgical complications, such as symptomatic hematoma or infection, are low (1-2%) and vary with the surgeon's experience and criteria used to diagnose the complication.  The patient had the opportunity to ask questions to his stated satisfaction. He voiced understanding of the above factors and stated that he has read all the information provided to him and the packets and informed consent  1. Family planning -vasectomy   Return for vasectomy.  Hildred LaserBrian James Vika Buske, MD  [facility] [address], [address] [place], [place] [address] [phone]

## 2017-06-05 ENCOUNTER — Ambulatory Visit (INDEPENDENT_AMBULATORY_CARE_PROVIDER_SITE_OTHER): Payer: BLUE CROSS/BLUE SHIELD | Admitting: Urology

## 2017-06-05 ENCOUNTER — Encounter: Payer: Self-pay | Admitting: Urology

## 2017-06-05 VITALS — BP 124/76 | HR 93 | Ht 72.0 in | Wt 300.9 lb

## 2017-06-05 DIAGNOSIS — Z3009 Encounter for other general counseling and advice on contraception: Secondary | ICD-10-CM

## 2017-06-05 MED ORDER — OXYCODONE-ACETAMINOPHEN 5-325 MG PO TABS: 1.0000 | ORAL_TABLET | ORAL | 0 refills | Status: DC | PRN

## 2017-06-05 NOTE — Progress Notes (Signed)
Bilateral Vasectomy Procedure  Pre-Procedure: - Patient's scrotum was prepped and draped for vasectomy. - The vas was palpated through the scrotal skin on the left. - 1% Xylocaine was injected into the skin and surrounding tissue for placement  - In a similar manner, the vas on the right was identified, anesthetized, and stabilized.  Procedure: - A scalpel was used to make a 1 cm incision in his left hemiscrotum - The left vas was isolated and brought up through the incision exposing that structure. - Bleeding points were cauterized as they occurred. - The vas was free from the surrounding structures and brought into view. - A segment was positioned for placement with a hemostat. - A second hemostat was placed and a small segment between the two hemostats and was removed for inspection. - Each end of the transected vas lumen was fulgurated/obliterated using needlepoint electrocautery. It was then tied off with a #2-0 silk suture -The same procedure was performed on the right. - A suture of #3-0 chromic catgut was used to close each lateral scrotal skin incision  Post-Procedure: - Patient was instructed in care of the operative area - A specimen is to be delivered in 12 and 16 weeks   -Another form of contraception is to be used until he is cleared by the office.   

## 2017-06-10 ENCOUNTER — Telehealth: Payer: Self-pay

## 2017-06-10 NOTE — Telephone Encounter (Signed)
Spouse Lurena JoinerRebecca called in stating patient is experiencing pain after vas on Thursday. Reassured spouse pain typical after vas surgery. Encouraged spouse to make sure pt is following vas post surgery instructions. Spouse verbalized understanding.

## 2017-06-20 ENCOUNTER — Encounter: Payer: Self-pay | Admitting: Urology

## 2017-06-20 ENCOUNTER — Ambulatory Visit (INDEPENDENT_AMBULATORY_CARE_PROVIDER_SITE_OTHER): Payer: BLUE CROSS/BLUE SHIELD | Admitting: Urology

## 2017-06-20 VITALS — BP 126/75 | HR 84 | Ht 72.0 in | Wt 300.6 lb

## 2017-06-20 DIAGNOSIS — N50812 Left testicular pain: Secondary | ICD-10-CM

## 2017-06-20 NOTE — Progress Notes (Signed)
06/20/2017 3:39 PM   Stanley Davis 1981-06-20 [id number]  Referring provider: Tamsen Roers, PA [address] [place], [place] [address]  Chief Complaint  Patient presents with  . Groin Swelling    HPI: The patient is a 36 year old gentleman who underwent a vasectomy approximately 2 weeks ago who presents today with new onset groin swelling.  He has noticed this since the day of his procedure. It is greater on the left side. It has been tender requiring aspirin though currently is not painful. He feels that his left testicle is bigger than his right. He denies fevers or chills and dysuria. His pain is currently controlled. His wife is more concerned about how it looks that he is. He did notice that the swelling slowly worsened when he did heavy lifting recently on the left side. He has never had this problem before.   PMH: Past Medical History:  Diagnosis Date  . Anxiety   . Mild sleep apnea     Surgical History: Past Surgical History:  Procedure Laterality Date  . HERNIA REPAIR     AT AGE 37    Home Medications:  Allergies as of 06/20/2017   No Known Allergies     Medication List       Accurate as of 06/20/17  3:39 PM. Always use your most recent med list.          diazepam 10 MG tablet Commonly known as:  VALIUM   oxyCODONE-acetaminophen 5-325 MG tablet Commonly known as:  ROXICET Take 1-2 tablets by mouth every 4 (four) hours as needed for severe pain.       Allergies: No Known Allergies  Family History: Family History  Problem Relation Age of Onset  . Liver cancer Mother   . Colon cancer Mother   . Healthy Sister   . Asthma Brother   . Bipolar disorder Brother   . Healthy Brother   . Diabetes Father   . Colon cancer Maternal Grandfather     Social History:  reports that he has never smoked. He has never used smokeless tobacco. He reports that he does not drink alcohol or use drugs.  ROS:                                         Physical Exam: BP 126/75 (BP Location: Left Arm, Patient Position: Sitting, Cuff Size: Large)   Pulse 84   Ht 6' (1.829 m)   Wt (!) 300 lb 9.6 oz (136.4 kg)   BMI 40.77 kg/m   Constitutional:  Alert and oriented, No acute distress. HEENT: [place] AT, moist mucus membranes.  Trachea midline, no masses. Cardiovascular: No clubbing, cyanosis, or edema. Respiratory: Normal respiratory effort, no increased work of breathing. GI: Abdomen is soft, nontender, nondistended, no abdominal masses GU: No CVA tenderness. Normal phallus. Right testicle unremarkable. Left testicle with likely postoperative changes and hematoma surrounding it. Nontender palpation. No evidence of epididymitis or orchitis. No evidence of wound infection or crepitus. Exam overall is consistent with postprocedure changes. Skin: No rashes, bruises or suspicious lesions. Lymph: No cervical or inguinal adenopathy. Neurologic: Grossly intact, no focal deficits, moving all 4 extremities. Psychiatric: Normal mood and affect.  Laboratory Data: Lab Results  Component Value Date   WBC 7.8 04/02/2016   HGB 17.0 04/02/2016   HCT 50.2 04/02/2016   MCV 86.4 04/02/2016   PLT 216 04/02/2016  Lab Results  Component Value Date   CREATININE 0.96 04/02/2016    No results found for: PSA  No results found for: TESTOSTERONE  No results found for: HGBA1C  Urinalysis    Component Value Date/Time   COLORURINE YELLOW 04/02/2016 0828   APPEARANCEUR CLEAR 04/02/2016 0828   LABSPEC 1.025 04/02/2016 0828   PHURINE 6.0 04/02/2016 0828   GLUCOSEU NEGATIVE 04/02/2016 0828   HGBUR NEGATIVE 04/02/2016 0828   BILIRUBINUR NEGATIVE 04/02/2016 0828   KETONESUR NEGATIVE 04/02/2016 0828   PROTEINUR NEGATIVE 04/02/2016 0828   NITRITE NEGATIVE 04/02/2016 0828   LEUKOCYTESUR NEGATIVE 04/02/2016 0828    Assessment & Plan:    1. Left scrotal discomfort after recent vasectomy I reassured the patient that his exam is not  concerning with no signs of infection. I did inform him that this swelling around his left testicle may take up to a month to completely resolved but otherwise is nothing to worry about. She was instructed to alert the office if he develops a fever, this pain becomes uncontrolled, or if he notices any other changes that concern him.  Return if symptoms worsen or fail to improve.  Hildred LaserBrian James Cameran Ahmed, MD  [facility] [address], [address] [place], [place] [address] [phone]

## 2017-06-25 ENCOUNTER — Ambulatory Visit
Admission: RE | Admit: 2017-06-25 | Discharge: 2017-06-25 | Disposition: A | Discharge status: Home / Self Care | Payer: BLUE CROSS/BLUE SHIELD | Source: Ambulatory Visit | Attending: Urology | Admitting: Urology

## 2017-06-25 ENCOUNTER — Ambulatory Visit (INDEPENDENT_AMBULATORY_CARE_PROVIDER_SITE_OTHER): Payer: BLUE CROSS/BLUE SHIELD | Admitting: Urology

## 2017-06-25 ENCOUNTER — Encounter: Payer: Self-pay | Admitting: Urology

## 2017-06-25 VITALS — BP 128/79 | HR 93 | Ht 73.0 in | Wt 301.2 lb

## 2017-06-25 DIAGNOSIS — S3022XA Contusion of scrotum and testes, initial encounter: Secondary | ICD-10-CM

## 2017-06-25 DIAGNOSIS — M7989 Other specified soft tissue disorders: Secondary | ICD-10-CM | POA: Diagnosis not present

## 2017-06-25 DIAGNOSIS — Z9852 Vasectomy status: Secondary | ICD-10-CM | POA: Insufficient documentation

## 2017-06-25 DIAGNOSIS — N433 Hydrocele, unspecified: Secondary | ICD-10-CM | POA: Insufficient documentation

## 2017-06-25 LAB — URINALYSIS, COMPLETE
Bilirubin, UA: NEGATIVE
GLUCOSE, UA: NEGATIVE
KETONES UA: NEGATIVE
LEUKOCYTES UA: NEGATIVE
NITRITE UA: NEGATIVE
Protein, UA: NEGATIVE
RBC, UA: NEGATIVE
Specific Gravity, UA: >1.03 — ABNORMAL HIGH (ref 1.005–1.030)
Urobilinogen, Ur: 0.2 mg/dL (ref 0.2–1.0)
pH, UA: 5 (ref 5.0–7.5)

## 2017-06-25 MED ORDER — CIPROFLOXACIN HCL 500 MG PO TABS: 500.0000 mg | ORAL_TABLET | Freq: Two times a day (BID) | ORAL | 0 refills | Status: DC

## 2017-06-25 NOTE — Progress Notes (Signed)
06/25/2017 9:51 AM   Vonna Drafts [birth year] [id number]  Referring provider: Tamsen Roers, PA [address] [place], [place] [address]  Chief Complaint  Patient presents with  . Post-op Problem    swollen L testicle after vasectomy    HPI: The patient is a 36 year old gentleman who underwent a vasectomy approximately 3 weeks ago who presents today with Increased swelling of the left side of his scrotum. He was seen approximately one week ago for this issue, but the patient reports that it has since worsened. He feels that its gets worse swells more after he goes to work which involves strenuous activity outside. He feels discomfort in the skin as it rubs against his underwear. He has no fevers or dysuria. His urinalysis today is normal. It isn't painful but more bothersome due to the size.  PMH: Past Medical History:  Diagnosis Date  . Anxiety   . Mild sleep apnea     Surgical History: Past Surgical History:  Procedure Laterality Date  . HERNIA REPAIR     AT AGE 48 and age 5   . VASECTOMY      Home Medications:  Allergies as of 06/25/2017   No Known Allergies     Medication List       Accurate as of 06/25/17  9:51 AM. Always use your most recent med list.          ciprofloxacin 500 MG tablet Commonly known as:  CIPRO Take 1 tablet (500 mg total) by mouth every 12 (twelve) hours.   diazepam 10 MG tablet Commonly known as:  VALIUM   oxyCODONE-acetaminophen 5-325 MG tablet Commonly known as:  ROXICET Take 1-2 tablets by mouth every 4 (four) hours as needed for severe pain.       Allergies: No Known Allergies  Family History: Family History  Problem Relation Age of Onset  . Liver cancer Mother   . Colon cancer Mother   . Healthy Sister   . Asthma Brother   . Bipolar disorder Brother   . Healthy Brother   . Diabetes Father   . Colon cancer Maternal Grandfather   . Prostate cancer Maternal Grandfather   . Kidney cancer Neg Hx   . Bladder  Cancer Neg Hx     Social History:  reports that he has never smoked. He has never used smokeless tobacco. He reports that he does not drink alcohol or use drugs.  ROS: UROLOGY Frequent Urination?: No Hard to postpone urination?: No Burning/pain with urination?: No Get up at night to urinate?: No Leakage of urine?: No Urine stream starts and stops?: No Trouble starting stream?: No Do you have to strain to urinate?: No Blood in urine?: No Urinary tract infection?: No Sexually transmitted disease?: No Injury to kidneys or bladder?: No Painful intercourse?: No Weak stream?: No Erection problems?: No Penile pain?: No  Gastrointestinal Nausea?: No Vomiting?: No Indigestion/heartburn?: No Diarrhea?: No Constipation?: No  Constitutional Fever: No Night sweats?: No Weight loss?: No Fatigue?: No  Skin Skin rash/lesions?: No Itching?: No  Eyes Blurred vision?: No Double vision?: No  Ears/Nose/Throat Sore throat?: No Sinus problems?: No  Hematologic/Lymphatic Swollen glands?: No Easy bruising?: No  Cardiovascular Leg swelling?: No Chest pain?: No  Respiratory Cough?: No Shortness of breath?: No  Endocrine Excessive thirst?: No  Musculoskeletal Back pain?: No Joint pain?: No  Neurological Headaches?: No Dizziness?: No  Psychologic Depression?: No Anxiety?: No  Physical Exam: BP 128/79   Pulse 93   Ht  6\' 1"  (1.854 m)   Wt (!) 301 lb 3.2 oz (136.6 kg)   BMI 39.74 kg/m   Constitutional:  Alert and oriented, No acute distress. HEENT: [place] AT, moist mucus membranes.  Trachea midline, no masses. Cardiovascular: No clubbing, cyanosis, or edema. Respiratory: Normal respiratory effort, no increased work of breathing. GI: Abdomen is soft, nontender, nondistended, no abdominal masses GU: No CVA tenderness. Normal phallus. Normal right testicle. Left testicle similar in size and firmness as previous exam last week. There is mild edema of the skin of the  left scrotum. Left hemiscrotum skin does seem slightly more erythematous in appearance compared to right side. Exam consistent with reactive hydrocele versus hematoma. Skin: No rashes, bruises or suspicious lesions. Lymph: No cervical or inguinal adenopathy. Neurologic: Grossly intact, no focal deficits, moving all 4 extremities. Psychiatric: Normal mood and affect.  Laboratory Data: Lab Results  Component Value Date   WBC 7.8 04/02/2016   HGB 17.0 04/02/2016   HCT 50.2 04/02/2016   MCV 86.4 04/02/2016   PLT 216 04/02/2016    Lab Results  Component Value Date   CREATININE 0.96 04/02/2016    No results found for: PSA  No results found for: TESTOSTERONE  No results found for: HGBA1C  Urinalysis    Component Value Date/Time   COLORURINE YELLOW 04/02/2016 0828   APPEARANCEUR CLEAR 04/02/2016 0828   LABSPEC 1.025 04/02/2016 0828   PHURINE 6.0 04/02/2016 0828   GLUCOSEU NEGATIVE 04/02/2016 0828   HGBUR NEGATIVE 04/02/2016 0828   BILIRUBINUR NEGATIVE 04/02/2016 0828   KETONESUR NEGATIVE 04/02/2016 0828   PROTEINUR NEGATIVE 04/02/2016 0828   NITRITE NEGATIVE 04/02/2016 0828   LEUKOCYTESUR NEGATIVE 04/02/2016 0828     Assessment & Plan:    1. Left scrotal hematoma status post vasectomy The patient exam today is consistent with a likely postvasectomy left scrotal hematoma versus reactive hydrocele. There is new mild edema of the left hemiscrotal skin with mild erythema. As of caution, we'll start him on antibiotics for orchitis though I have somewhat low suspicion for this. The patient is very concerned about this and this is the second visit he has had for the same issue. I think at this point would be reasonable to obtain a scrotal ultrasound at this time. At the very least this will give the patient peace oind. We will have him undergo the scrotal ultrasound today.  I strongly advised him to discontinue strenuous activity for at least the next week. He was given advised that  the testicle will not return to its normal size for at least a month.  Addendum: Scrotal u/s with hydrocele on left. Otherwise normal.    Hildred LaserBrian James Emilianna Barlowe, MD  [facility] [address], [address] [place], [place] [address] [phone]

## 2017-07-08 ENCOUNTER — Telehealth: Payer: Self-pay | Admitting: Urology

## 2017-07-08 NOTE — Telephone Encounter (Signed)
Patient was given scrotal u/s results and was told to keep wearing supportive underwear and use OTC NSAIDs and ice as needed for swelling. He was encouraged to continue light duty until swelling went down. Patient states he is having trouble with his job giving him light duty, he may need an extended note. He was told to call back if he needed an updated one.

## 2017-07-08 NOTE — Telephone Encounter (Signed)
Pt called office stating that he had a vasectomy over 1 and half months ago, pt states he has come to office twice due to left testicle being the size of baseball and being uncomfortable, last appt was sent to have scrotal ultrasound on 7/11, pt states he is still swollen (still the size of a baseball) and is very concerned that no one has called him with the results, what's going on or what to do about his swollen testicle.  Pt is very concerned and asks if this is normal.  Please advise.

## 2017-09-02 ENCOUNTER — Other Ambulatory Visit: Payer: BLUE CROSS/BLUE SHIELD

## 2017-09-02 DIAGNOSIS — Z9852 Vasectomy status: Secondary | ICD-10-CM

## 2017-09-04 LAB — POST-VAS SPERM EVALUATION,QUAL: Volume: 2.1 mL

## 2017-09-05 ENCOUNTER — Telehealth: Payer: Self-pay

## 2017-09-05 NOTE — Telephone Encounter (Signed)
Hildred Laser, MD  Rupert Stacks C, LPN        Please let patient no sperm in semen analysis. Needs one more in a month. Use protection until that time. Thanks    [facility]

## 2017-09-05 NOTE — Telephone Encounter (Signed)
Spoke with patient  °All questions answered ° ° °Stanley Davis °

## 2017-09-08 ENCOUNTER — Ambulatory Visit (INDEPENDENT_AMBULATORY_CARE_PROVIDER_SITE_OTHER): Payer: BLUE CROSS/BLUE SHIELD | Admitting: Family Medicine

## 2017-09-08 ENCOUNTER — Encounter: Payer: Self-pay | Admitting: Family Medicine

## 2017-09-08 VITALS — BP 132/88 | HR 90 | Temp 98.3°F | Wt 301.0 lb

## 2017-09-08 DIAGNOSIS — J039 Acute tonsillitis, unspecified: Secondary | ICD-10-CM

## 2017-09-08 MED ORDER — AMOXICILLIN 875 MG PO TABS: 875.0000 mg | ORAL_TABLET | Freq: Two times a day (BID) | ORAL | 0 refills | Status: DC

## 2017-09-08 NOTE — Patient Instructions (Signed)
Tonsillitis Tonsillitis is an infection of the throat that causes the tonsils to become red, tender, and swollen. Tonsils are collections of lymphoid tissue at the back of the throat. Each tonsil has crevices (crypts). Tonsils help fight nose and throat infections and keep infection from spreading to other parts of the body for the first 18 months of life. What are the causes? Sudden (acute) tonsillitis is usually caused by infection with streptococcal bacteria. Long-lasting (chronic) tonsillitis occurs when the crypts of the tonsils become filled with pieces of food and bacteria, which makes it easy for the tonsils to become repeatedly infected. What are the signs or symptoms? Symptoms of tonsillitis include:  A sore throat, with possible difficulty swallowing.  White patches on the tonsils.  Fever.  Tiredness.  New episodes of snoring during sleep, when you did not snore before.  Small, foul-smelling, yellowish-white pieces of material (tonsilloliths) that you occasionally cough up or spit out. The tonsilloliths can also cause you to have bad breath.  How is this diagnosed? Tonsillitis can be diagnosed through a physical exam. Diagnosis can be confirmed with the results of lab tests, including a throat culture. How is this treated? The goals of tonsillitis treatment include the reduction of the severity and duration of symptoms and prevention of associated conditions. Symptoms of tonsillitis can be improved with the use of steroids to reduce the swelling. Tonsillitis caused by bacteria can be treated with antibiotic medicines. Usually, treatment with antibiotic medicines is started before the cause of the tonsillitis is known. However, if it is determined that the cause is not bacterial, antibiotic medicines will not treat the tonsillitis. If attacks of tonsillitis are severe and frequent, your health care provider may recommend surgery to remove the tonsils (tonsillectomy). Follow these  instructions at home:  Rest as much as possible and get plenty of sleep.  Drink plenty of fluids. While the throat is very sore, eat soft foods or liquids, such as sherbet, soups, or instant breakfast drinks.  Eat frozen ice pops.  Gargle with a warm or cold liquid to help soothe the throat. Mix 1/4 teaspoon of salt and 1/4 teaspoon of baking soda in 8 oz of water. Contact a health care provider if:  Large, tender lumps develop in your neck.  A rash develops.  A green, yellow-brown, or bloody substance is coughed up.  You are unable to swallow liquids or food for 24 hours.  You notice that only one of the tonsils is swollen. Get help right away if:  You develop any new symptoms such as vomiting, severe headache, stiff neck, chest pain, or trouble breathing or swallowing.  You have severe throat pain along with drooling or voice changes.  You have severe pain, unrelieved with recommended medications.  You are unable to fully open the mouth.  You develop redness, swelling, or severe pain anywhere in the neck.  You have a fever. This information is not intended to replace advice given to you by your health care provider. Make sure you discuss any questions you have with your health care provider. Document Released: 09/11/2005 Document Revised: 05/09/2016 Document Reviewed: 05/21/2013 Elsevier Interactive Patient Education  2017 Elsevier Inc.  

## 2017-09-08 NOTE — Progress Notes (Signed)
   Patient: Stanley Davis Male    DOB: 09/03/1981   36 y.o.   MRN: [id number]  Visit Date: 09/08/2017  Today's Provider: Dortha Kern, PA   Chief Complaint  Patient presents with  . Sore Throat   Subjective:    Sore Throat   This is a new problem. Episode onset: Saturday. The problem has been gradually worsening. Neither side of throat is experiencing more pain than the other. The maximum temperature recorded prior to his arrival was 101 - 101.9 F. The fever has been present for less than 1 day. Treatments tried: Aleve.   Past Medical History:  Diagnosis Date  . Anxiety   . Mild sleep apnea    Family History  Problem Relation Age of Onset  . Liver cancer Mother   . Colon cancer Mother   . Healthy Sister   . Asthma Brother   . Bipolar disorder Brother   . Healthy Brother   . Diabetes Father   . Colon cancer Maternal Grandfather   . Prostate cancer Maternal Grandfather   . Kidney cancer Neg Hx   . Bladder Cancer Neg Hx    Past Surgical History:  Procedure Laterality Date  . HERNIA REPAIR     AT AGE 18 and age 50   . VASECTOMY     No Known Allergies   Previous Medications   No medications on file    Review of Systems  Constitutional: Negative.   HENT: Positive for sore throat.   Respiratory: Negative.   Cardiovascular: Negative.     Social History  Substance Use Topics  . Smoking status: Never Smoker  . Smokeless tobacco: Never Used  . Alcohol use No   Objective:   BP 132/88 (BP Location: Right Arm, Patient Position: Sitting, Cuff Size: Large)   Pulse 90   Temp 98.3 F (36.8 C) (Oral)   Wt (!) 301 lb (136.5 kg)   SpO2 94%   BMI 39.71 kg/m   Physical Exam  Constitutional: He is oriented to person, place, and time. He appears well-developed and well-nourished. No distress.  HENT:  Head: Normocephalic and atraumatic.  Right Ear: Hearing normal.  Left Ear: Hearing normal.  Nose: Nose normal.  Enlarged tonsils with exudates.  Eyes: Conjunctivae and  lids are normal. Right eye exhibits no discharge. Left eye exhibits no discharge. No scleral icterus.  Neck: Neck supple.  Cardiovascular: Normal rate and regular rhythm.   Pulmonary/Chest: Effort normal. No respiratory distress.  Abdominal: Soft. Bowel sounds are normal.  Musculoskeletal: Normal range of motion.  Lymphadenopathy:    He has cervical adenopathy.  Neurological: He is alert and oriented to person, place, and time.  Skin: Skin is intact. No lesion and no rash noted.  Psychiatric: He has a normal mood and affect. His speech is normal and behavior is normal. Thought content normal.      Assessment & Plan:     1. Exudative tonsillitis Onset yesterday with fever and sore throat. Aleve has helped with discomfort and fever. Will treat with Amoxil and saltwater gargles. Increase fluid intake and recheck prn. Given note for out of work for this appointment today. - amoxicillin (AMOXIL) 875 MG tablet; Take 1 tablet (875 mg total) by mouth 2 (two) times daily.  Dispense: 20 tablet; Refill: 0

## 2017-10-09 ENCOUNTER — Other Ambulatory Visit: Payer: BLUE CROSS/BLUE SHIELD

## 2017-10-09 DIAGNOSIS — Z9852 Vasectomy status: Secondary | ICD-10-CM

## 2017-10-11 LAB — POST-VAS SPERM EVALUATION,QUAL: SEMEN VOLUME: 2.5 mL

## 2017-10-17 ENCOUNTER — Telehealth: Payer: Self-pay | Admitting: Urology

## 2017-10-17 NOTE — Telephone Encounter (Signed)
Spoke with patient gave him his results and all questions answered   Marcelino DusterMichelle

## 2018-03-20 IMAGING — CT CT ABD-PELV W/ CM
2 of 4 series · 17 of 46 positions shown, 19 images · IV contrast (omnipaque)
Comparison: None.

CLINICAL DATA: Right mid and lower quadrant pain for 1 day

EXAM:
CT ABDOMEN AND PELVIS WITH CONTRAST
TECHNIQUE: Multidetector CT imaging of the abdomen and pelvis was performed
using the standard protocol following bolus administration of
intravenous contrast. Oral contrast was also administered.
CONTRAST:  100mL OMNIPAQUE IOHEXOL 350 MG/ML SOLN

[Series 2: axial soft tissue · axial · 0.81mm/px · z∈[-1020,-530]mm · 14 of 108 slices shown, 16 images]
[im 5/108  soft-tissue]
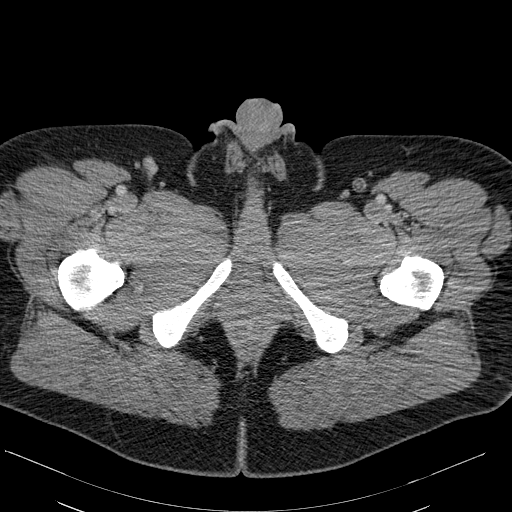
[im 5/108  bone]
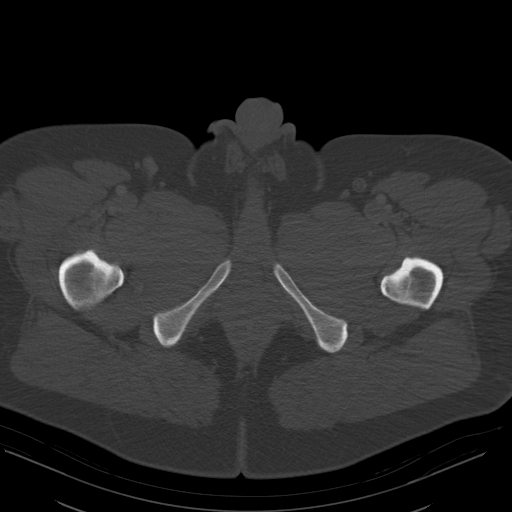
[im 14/108  soft-tissue]
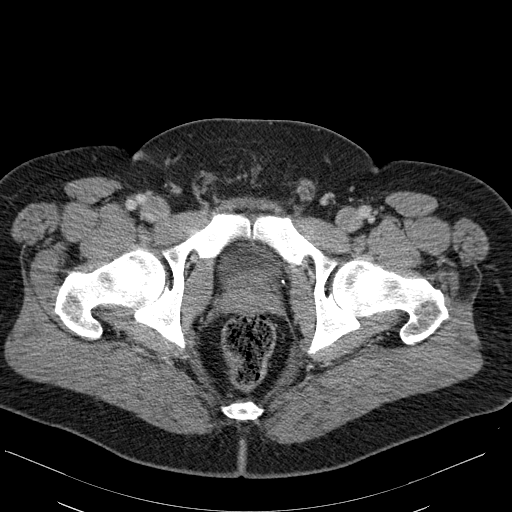
[im 19/108  soft-tissue]
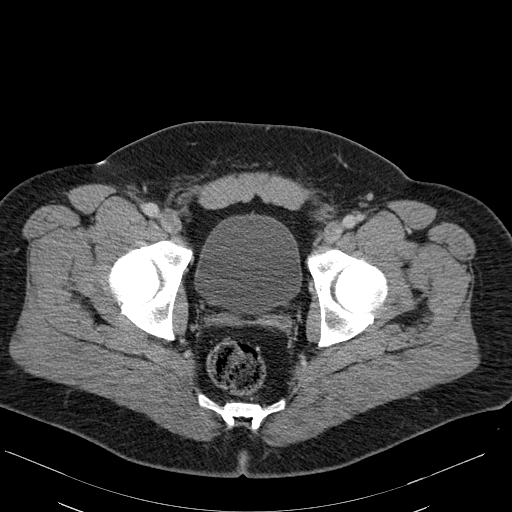
[im 28/108  soft-tissue]
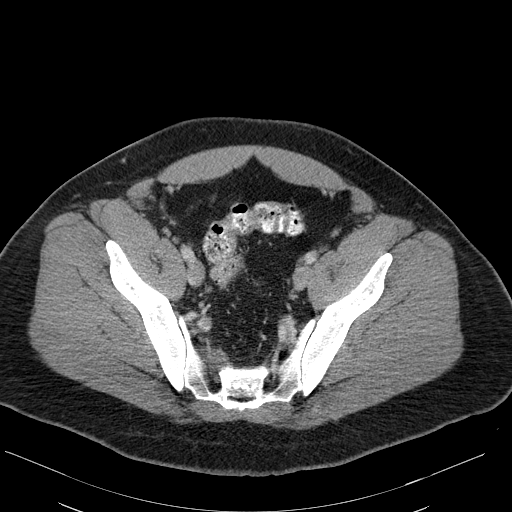
[im 38/108  soft-tissue]
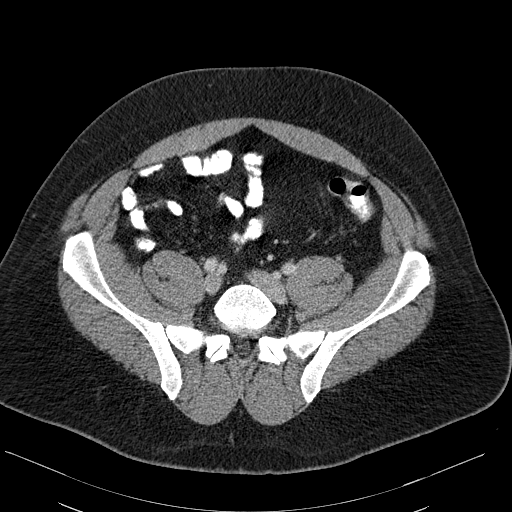
[im 42/108  soft-tissue]
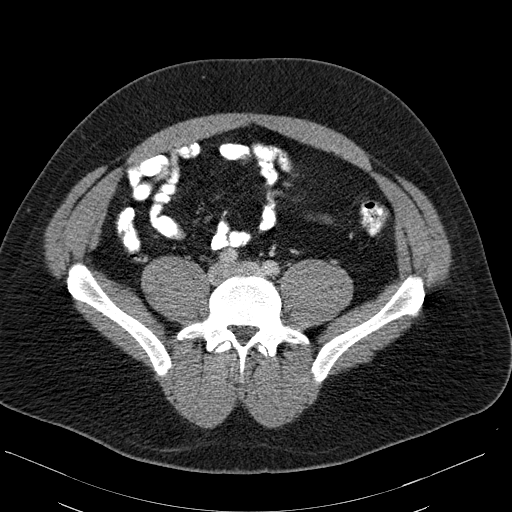
[im 52/108  soft-tissue]
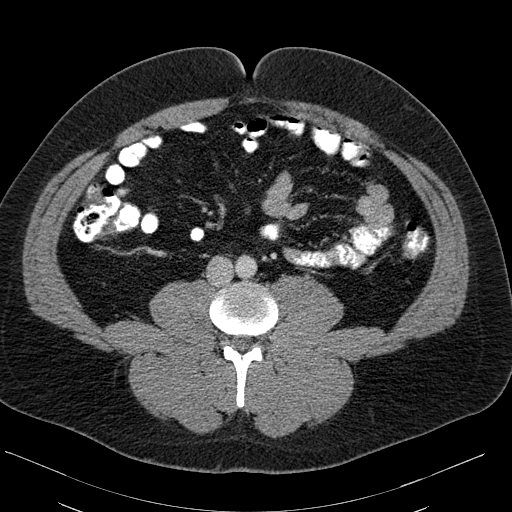
[im 56/108  soft-tissue]
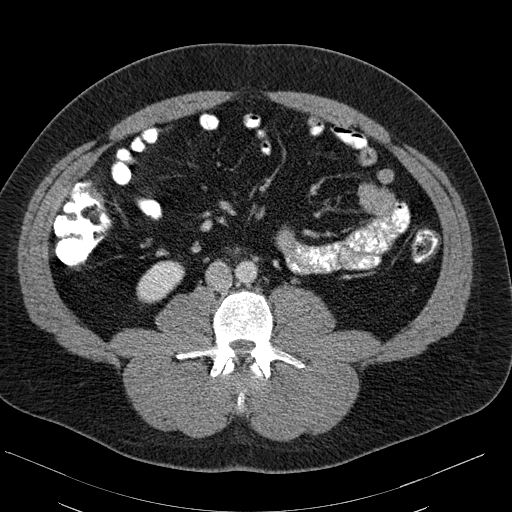
[im 66/108  soft-tissue]
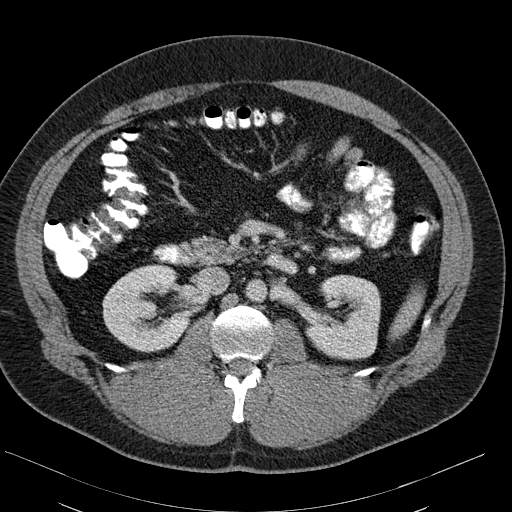
[im 66/108  bone]
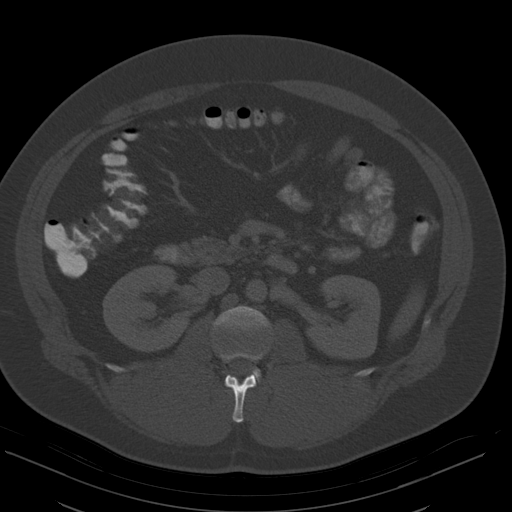
[im 70/108  soft-tissue]
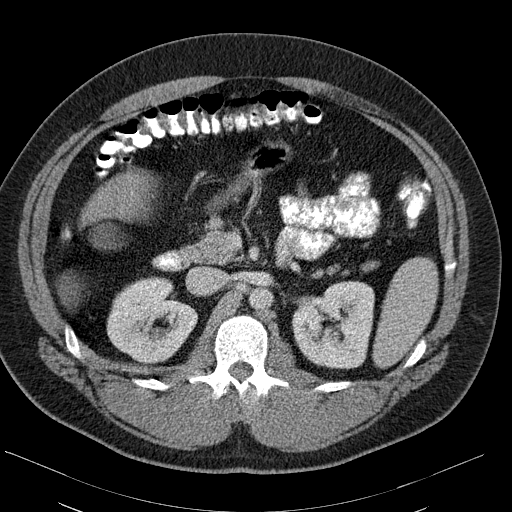
[im 80/108  soft-tissue]
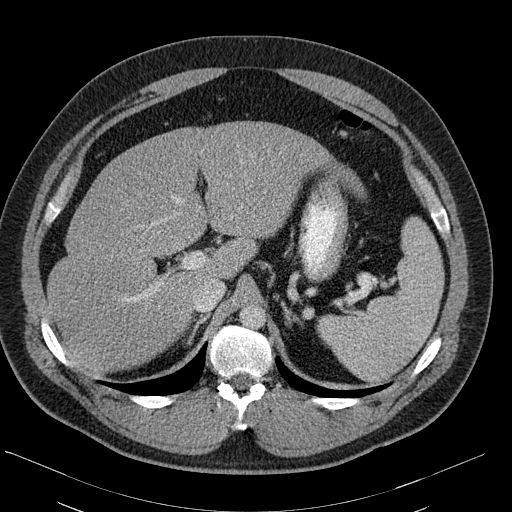
[im 89/108  soft-tissue]
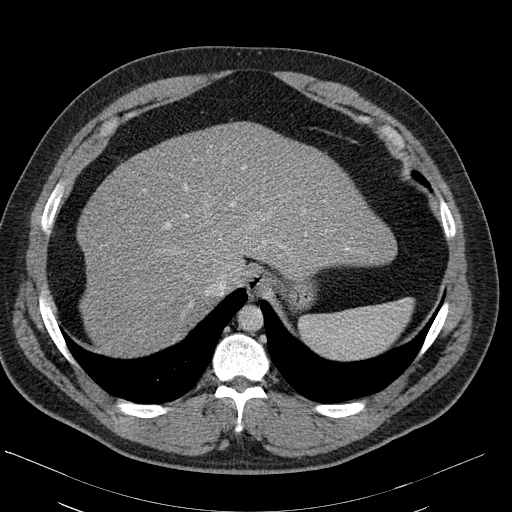
[im 94/108  soft-tissue]
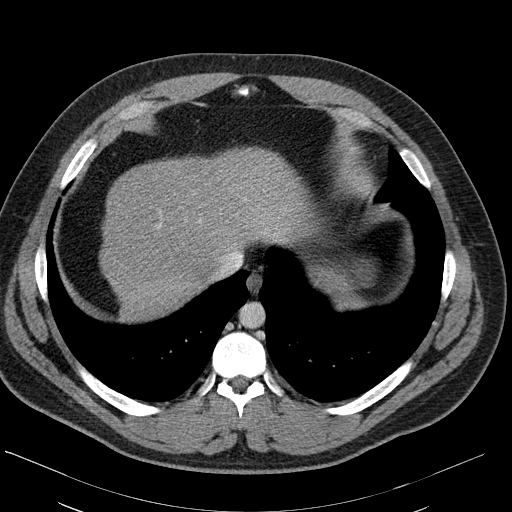
[im 103/108  soft-tissue]
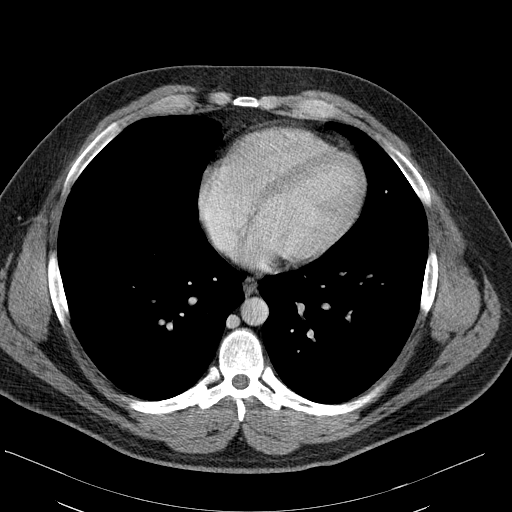

[Series 602: coronal · coronal · 1.03mm/px · 3 of 148 slices shown]
[im 50/148  soft-tissue]
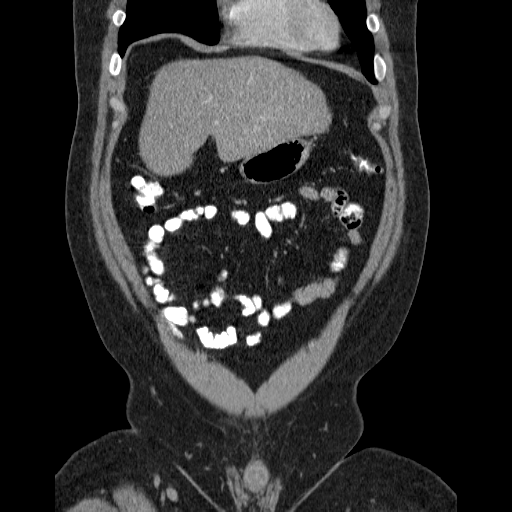
[im 66/148  soft-tissue]
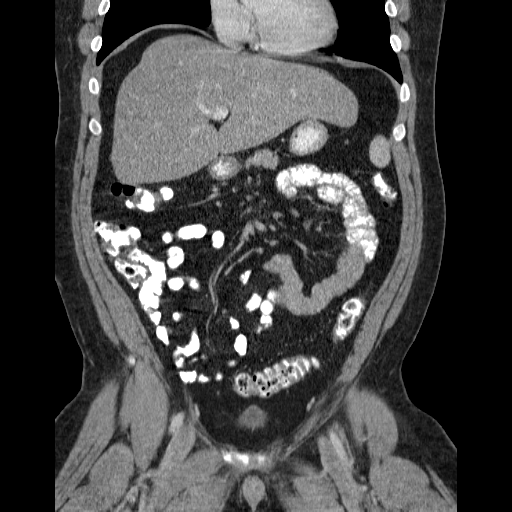
[im 82/148  soft-tissue]
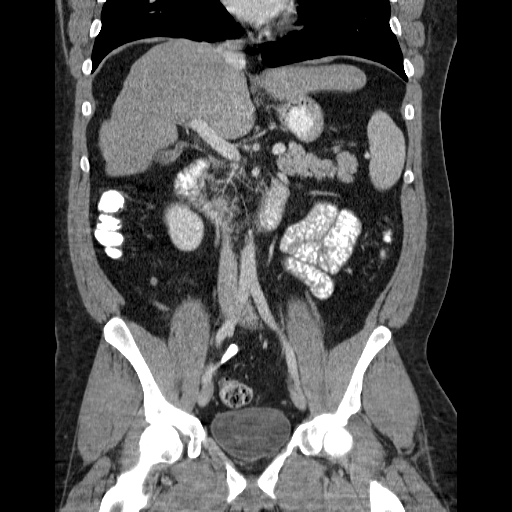

[17 of 46 positions shown; findings below may reference images not displayed]

FINDINGS: Lower chest:  Lung bases are clear.

Hepatobiliary: There is a degree of hepatic steatosis. No focal
liver lesions are identified. The gallbladder wall is not
appreciably thickened. There is no biliary duct dilatation.

Pancreas: There is no pancreatic mass or inflammatory focus.

Spleen: No splenic lesions are evident.

Adrenals/Urinary Tract: Adrenals appear normal bilaterally. Kidneys
bilaterally show no mass or hydronephrosis on either side. There is
no renal or ureteral calculus on either side. The urinary bladder is
midline with wall thickness within normal limits.

Stomach/Bowel: There is no bowel wall or mesenteric thickening. No
bowel obstruction. No free air or portal venous air.

Vascular/Lymphatic: There is no abdominal aortic aneurysm. No
vascular lesions are evident. There is no demonstrable adenopathy in
the abdomen or pelvis.

Reproductive: Prostate and seminal vesicles appear normal. There is
no pelvic mass or pelvic fluid collection.

Other: Appendix appears normal. No ascites or abscess is evident in
the abdomen or pelvis. There is a minimal ventral hernia containing
only fat.

Musculoskeletal: There are no blastic or lytic bone lesions. There
is no intramuscular or abdominal wall lesion.
IMPRESSION: No bowel obstruction.  No abscess.  Appendix appears normal.

No renal or ureteral calculus.  No hydronephrosis.

There is a degree of hepatic steatosis. There is a minimal ventral
hernia containing only fat.

## 2018-12-07 ENCOUNTER — Ambulatory Visit (INDEPENDENT_AMBULATORY_CARE_PROVIDER_SITE_OTHER): Payer: Worker's Compensation | Admitting: Podiatry

## 2018-12-07 ENCOUNTER — Ambulatory Visit (INDEPENDENT_AMBULATORY_CARE_PROVIDER_SITE_OTHER): Payer: Worker's Compensation

## 2018-12-07 ENCOUNTER — Other Ambulatory Visit: Payer: Self-pay | Admitting: Podiatry

## 2018-12-07 DIAGNOSIS — M79672 Pain in left foot: Secondary | ICD-10-CM

## 2018-12-07 DIAGNOSIS — S99922A Unspecified injury of left foot, initial encounter: Secondary | ICD-10-CM

## 2018-12-07 DIAGNOSIS — S92911A Unspecified fracture of right toe(s), initial encounter for closed fracture: Secondary | ICD-10-CM | POA: Diagnosis not present

## 2018-12-17 NOTE — Progress Notes (Signed)
Subjective:   Patient ID: Stanley Davis, male   DOB: 38 y.o.   MRN: [id number]   HPI 38 year old male presents the office today for second opinion regards to fractures of toes on his left foot.  He states that on 11/09/2018 a metal trailer door did not lock indicated on his foot.  He was seen at the urgent care for this and he was placed into a cam boot.  He is continued to have pain to the toes he has been using ibuprofen 800 mg.  He states that hurts on the first and third toes.  He has no other concerns today.   Review of Systems  All other systems reviewed and are negative.  Past Medical History:  Diagnosis Date  . Anxiety   . Mild sleep apnea     Past Surgical History:  Procedure Laterality Date  . HERNIA REPAIR     AT AGE 61 and age 67   . VASECTOMY       Current Outpatient Medications:  .  amoxicillin (AMOXIL) 875 MG tablet, Take 1 tablet (875 mg total) by mouth 2 (two) times daily., Disp: 20 tablet, Rfl: 0 .  cyclobenzaprine (FLEXERIL) 5 MG tablet, cyclobenzaprine 5 mg tablet  TAKE 1 TABLET BY MOUTH 3 TIMES A DAY AS NEEDED FOR MUSCLE SPASMS, Disp: , Rfl:  .  ibuprofen (ADVIL,MOTRIN) 800 MG tablet, ibuprofen 800 mg tablet, Disp: , Rfl:  .  meloxicam (MOBIC) 15 MG tablet, meloxicam 15 mg tablet  TAKE 1 TABLET BY MOUTH DAILY AS NEEDED FOR INFLAMMATORY PAIN, Disp: , Rfl:   No Known Allergies       Objective:  Physical Exam  General: AAO x3, NAD  Dermatological: Skin is warm, dry and supple bilateral. Nails x 10 are well manicured; remaining integument appears unremarkable at this time.  There is no subungual hematoma to the toenails.  There are no open sores, no preulcerative lesions, no rash or signs of infection present.  Vascular: Dorsalis Pedis artery and Posterior Tibial artery pedal pulses are 2/4 bilateral with immedate capillary fill time. There is no pain with calf compression, swelling, warmth, erythema.   Neruologic: Grossly intact via light touch bilateral.   Protective threshold with Semmes Wienstein monofilament intact to all pedal sites bilateral.   Musculoskeletal: There is mild tenderness to palpation to the left hallux and third toes mostly towards the distal portion.  There is minimal edema there is no erythema or warmth.  No pain to metatarsals or proximal foot or ankle.  Muscular strength 5/5 in all groups tested bilateral.  Gait: Unassisted, Nonantalgic.       Assessment:   Healing toe fractures     Plan:  -Treatment options discussed including all alternatives, risks, and complications -Etiology of symptoms were discussed -X-rays were obtained and reviewed with the patient.  No avulsion fracture present of the third digit.  Questionable avulsion fracture to the plantar hallux but no other evidence of acute fracture identified.  He has point tenderness continued to have her remain in a cam boot for now.  He has a follow-up appointment scheduled for the urgent care.  This is in 2 weeks at that point I would expect for him to be able to transition back to regular shoe.  Vivi Barrack DPM

## 2019-03-11 IMAGING — US US SCROTUM
1 series · 14 of 25 positions shown · non-contrast
Comparison: None.

CLINICAL DATA: Status post vasectomy on 06/05/2017. Scrotal
hematoma.

EXAM:
SCROTAL ULTRASOUND
DOPPLER ULTRASOUND OF THE TESTICLES
TECHNIQUE: Complete ultrasound examination of the testicles, epididymis, and
other scrotal structures was performed. Color and spectral Doppler
ultrasound were also utilized to evaluate blood flow to the
testicles.

[Series 1: us scrotum · 0.08mm/px · 14 of 93 slices shown]
[im 1/93]
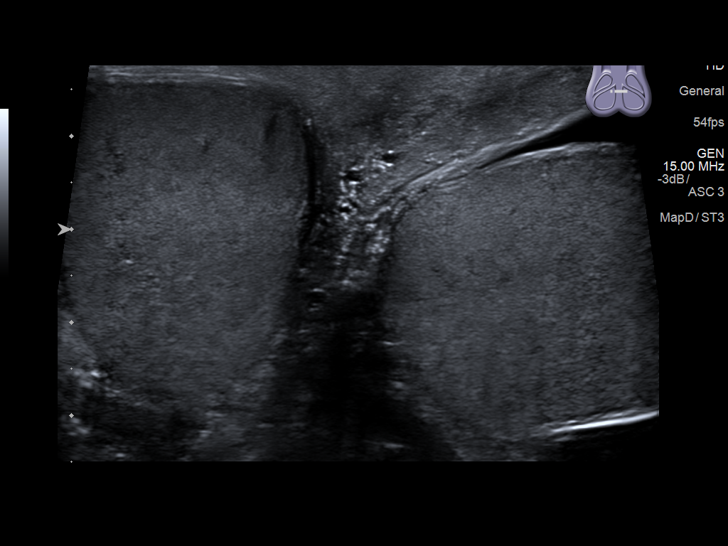
[im 8/93]
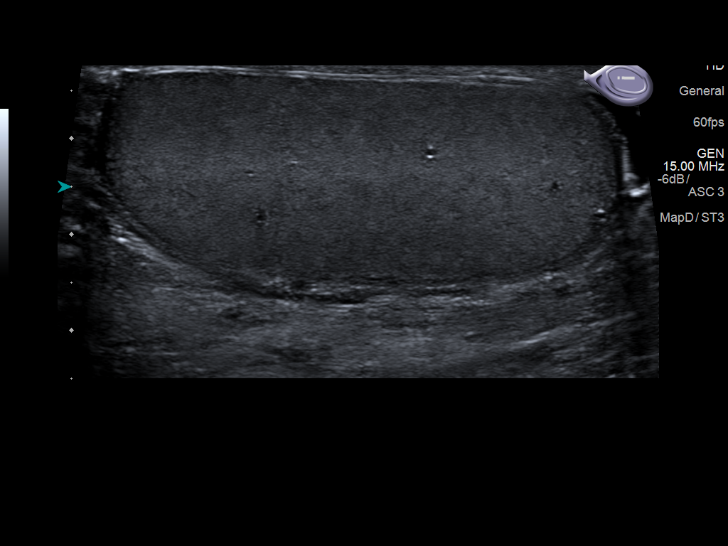
[im 16/93]
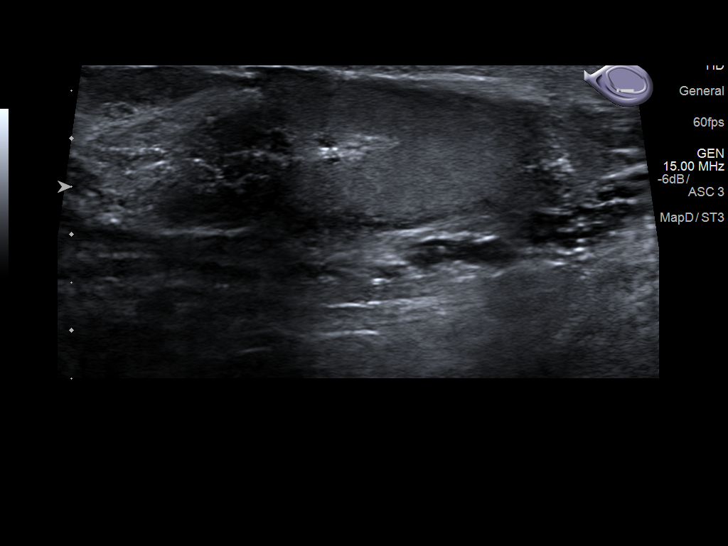
[im 24/93]
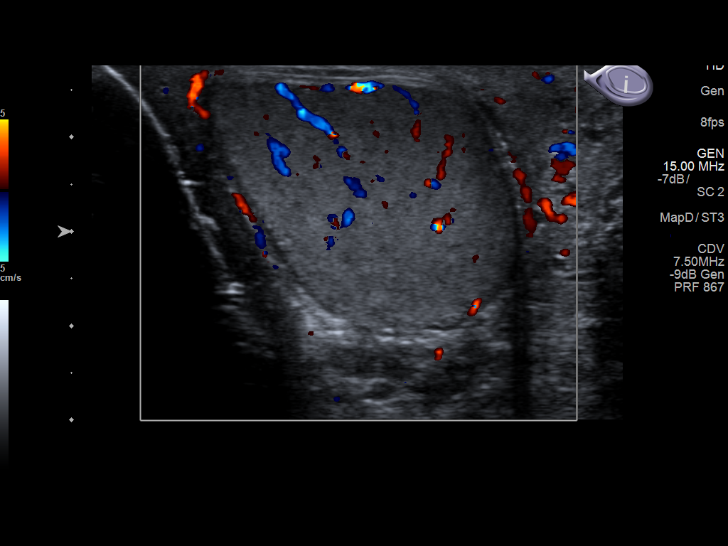
[im 31/93]
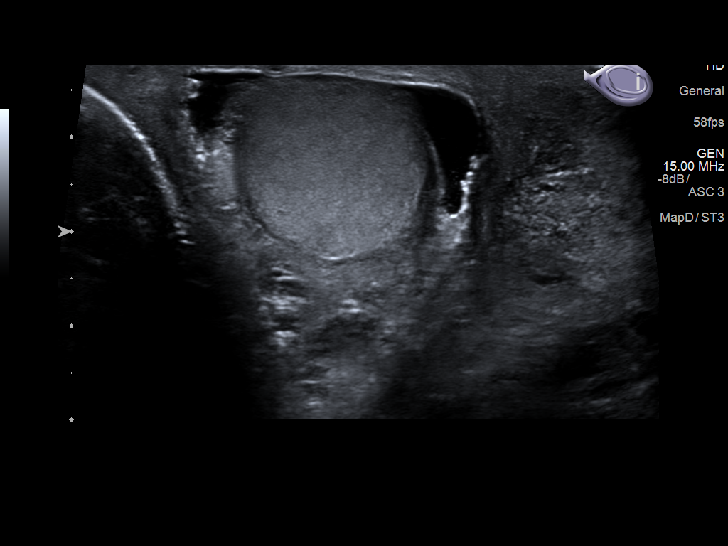
[im 35/93]
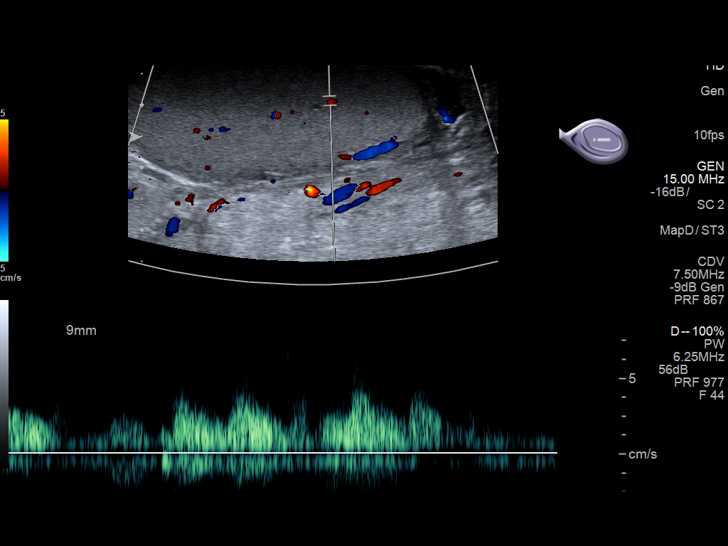
[im 43/93]
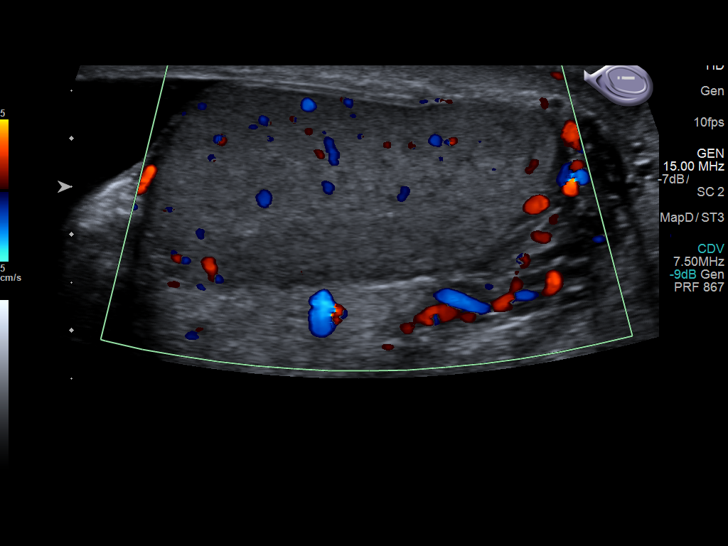
[im 50/93]
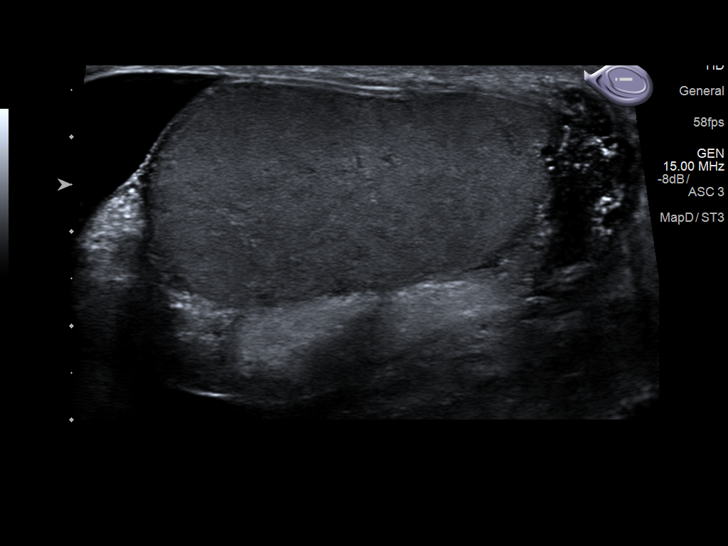
[im 58/93]
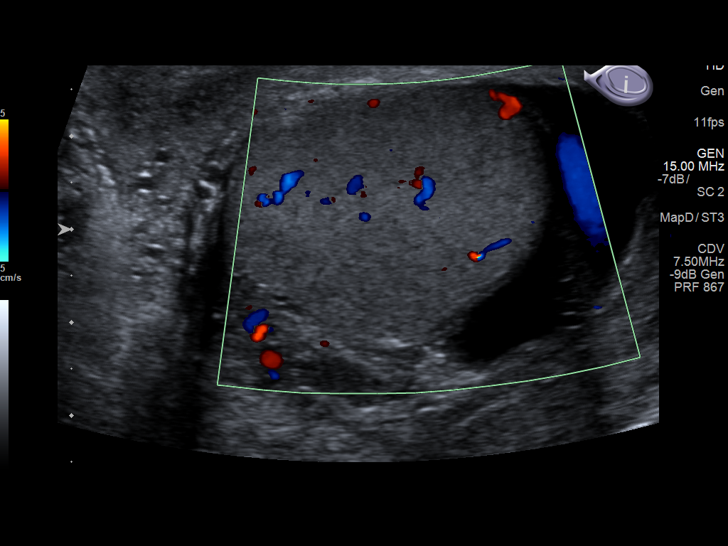
[im 62/93]
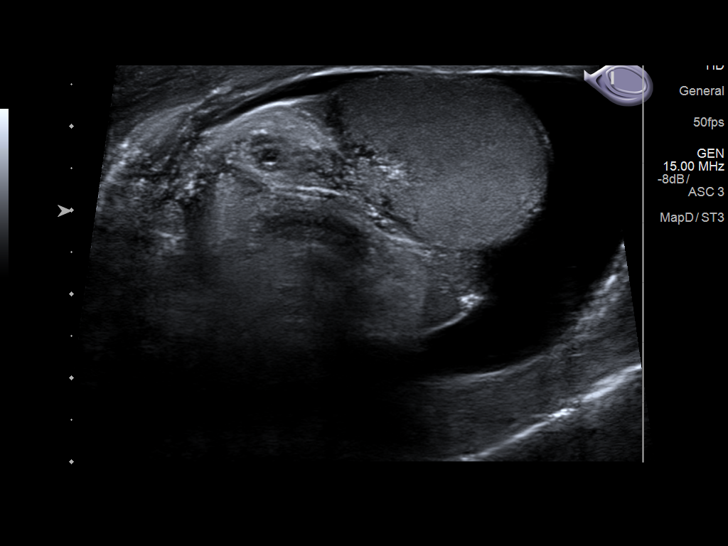
[im 70/93]
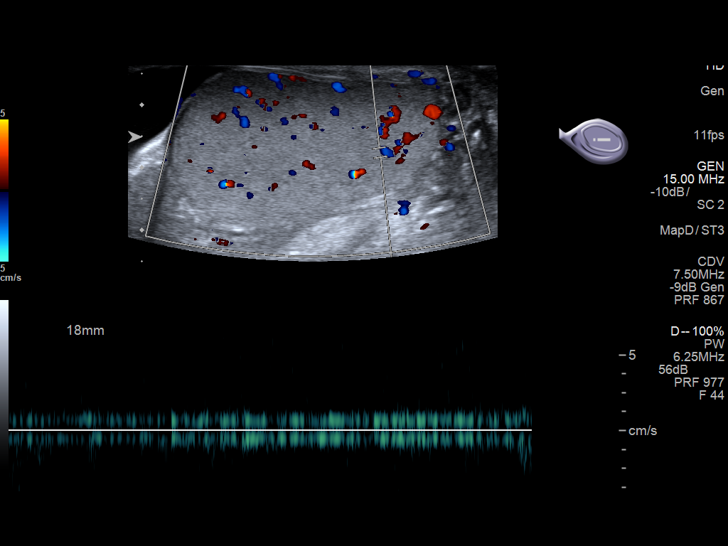
[im 77/93]
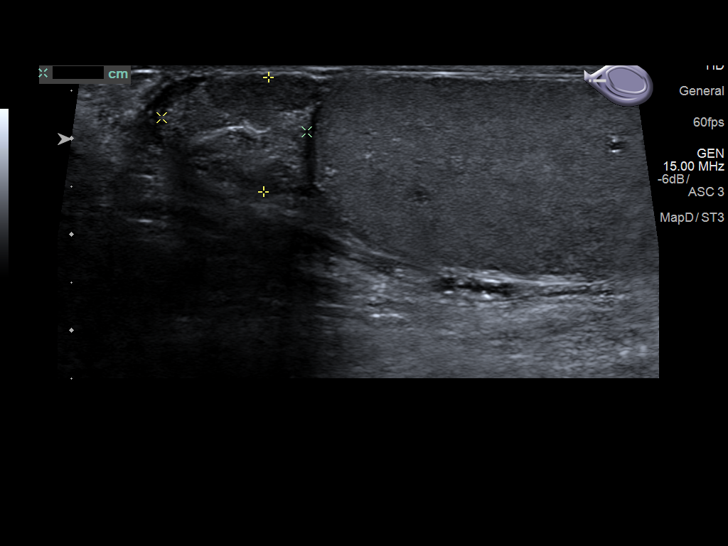
[im 85/93]
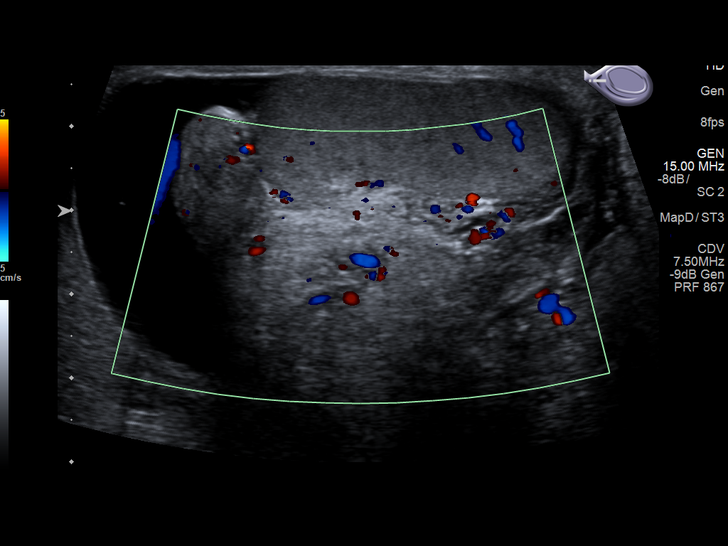
[im 93/93]
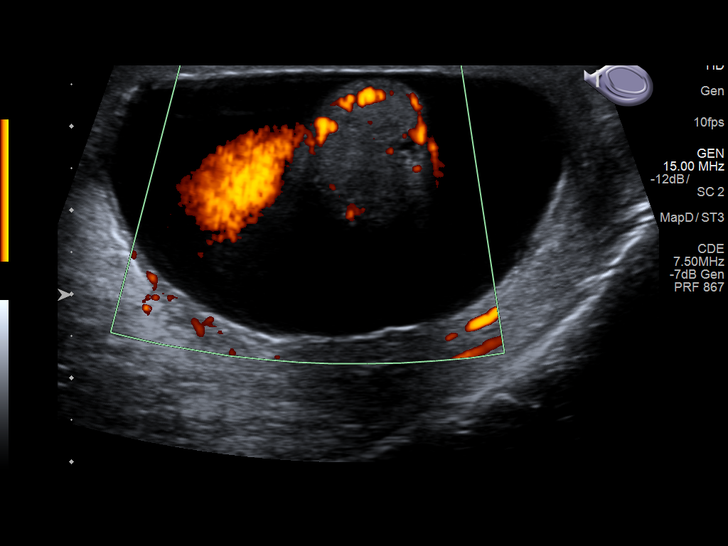

[14 of 25 positions shown; findings below may reference images not displayed]

FINDINGS: Right testicle

Measurements: 5.4 x 2.1 x 2.8 cm. No mass or microlithiasis
visualized.

Left testicle

Measurements: 4.4 x 2.4 x 3.5 cm. No mass or microlithiasis
visualized.

Right epididymis:  Normal in size and appearance.

Left epididymis: Somewhat prominent and heterogeneous. Otherwise
unremarkable.

Hydrocele:  Small left and minimal right hydroceles.

Varicocele:  None visualized.

Pulsed Doppler interrogation of both testes demonstrates normal low
resistance arterial and venous waveforms bilaterally.
IMPRESSION: 1. No acute findings. No testicular mass or torsion. No convincing
scrotal hematoma.
2. Small left and minimal right hydroceles.

## 2020-04-02 ENCOUNTER — Encounter: Payer: Self-pay | Admitting: Emergency Medicine

## 2020-04-02 ENCOUNTER — Emergency Department
Admission: EM | Admit: 2020-04-02 | Discharge: 2020-04-02 | Disposition: A | Discharge status: Home / Self Care | Payer: HRSA Program | Attending: Emergency Medicine | Admitting: Emergency Medicine

## 2020-04-02 ENCOUNTER — Other Ambulatory Visit: Payer: Self-pay

## 2020-04-02 ENCOUNTER — Emergency Department: Payer: HRSA Program

## 2020-04-02 DIAGNOSIS — R05 Cough: Secondary | ICD-10-CM | POA: Diagnosis not present

## 2020-04-02 DIAGNOSIS — U071 COVID-19: Secondary | ICD-10-CM | POA: Diagnosis not present

## 2020-04-02 DIAGNOSIS — R55 Syncope and collapse: Secondary | ICD-10-CM | POA: Diagnosis present

## 2020-04-02 DIAGNOSIS — Z20822 Contact with and (suspected) exposure to covid-19: Secondary | ICD-10-CM | POA: Insufficient documentation

## 2020-04-02 DIAGNOSIS — R0981 Nasal congestion: Secondary | ICD-10-CM | POA: Insufficient documentation

## 2020-04-02 LAB — CBC WITH DIFFERENTIAL/PLATELET
Abs Immature Granulocytes: 0.01 10*3/uL (ref 0.00–0.07)
Basophils Absolute: 0 10*3/uL (ref 0.0–0.1)
Basophils Relative: 0 %
Eosinophils Absolute: 0 10*3/uL (ref 0.0–0.5)
Eosinophils Relative: 0 %
HCT: 45.5 % (ref 39.0–52.0)
Hemoglobin: 15.1 g/dL (ref 13.0–17.0)
Immature Granulocytes: 0 %
Lymphocytes Relative: 24 %
Lymphs Abs: 1.1 10*3/uL (ref 0.7–4.0)
MCH: 29.5 pg (ref 26.0–34.0)
MCHC: 33.2 g/dL (ref 30.0–36.0)
MCV: 88.9 fL (ref 80.0–100.0)
Monocytes Absolute: 0.5 10*3/uL (ref 0.1–1.0)
Monocytes Relative: 12 %
Neutro Abs: 3 10*3/uL (ref 1.7–7.7)
Neutrophils Relative %: 64 %
Platelets: 142 10*3/uL — ABNORMAL LOW (ref 150–400)
RBC: 5.12 MIL/uL (ref 4.22–5.81)
RDW: 12.8 % (ref 11.5–15.5)
WBC: 4.6 10*3/uL (ref 4.0–10.5)
nRBC: 0 % (ref 0.0–0.2)

## 2020-04-02 LAB — COMPREHENSIVE METABOLIC PANEL
ALT: 111 U/L — ABNORMAL HIGH (ref 0–44)
AST: 87 U/L — ABNORMAL HIGH (ref 15–41)
Albumin: 4.1 g/dL (ref 3.5–5.0)
Alkaline Phosphatase: 65 U/L (ref 38–126)
Anion gap: 9 (ref 5–15)
BUN: 11 mg/dL (ref 6–20)
CO2: 26 mmol/L (ref 22–32)
Calcium: 8.6 mg/dL — ABNORMAL LOW (ref 8.9–10.3)
Chloride: 101 mmol/L (ref 98–111)
Creatinine, Ser: 1.07 mg/dL (ref 0.61–1.24)
GFR calc Af Amer: >60 mL/min (ref >60)
GFR calc non Af Amer: >60 mL/min (ref >60)
Glucose, Bld: 118 mg/dL — ABNORMAL HIGH (ref 70–99)
Potassium: 4 mmol/L (ref 3.5–5.1)
Sodium: 136 mmol/L (ref 135–145)
Total Bilirubin: 0.8 mg/dL (ref 0.3–1.2)
Total Protein: 7.1 g/dL (ref 6.5–8.1)

## 2020-04-02 LAB — URINALYSIS, COMPLETE (UACMP) WITH MICROSCOPIC
Bilirubin Urine: NEGATIVE
Glucose, UA: NEGATIVE mg/dL
Hgb urine dipstick: NEGATIVE
Ketones, ur: NEGATIVE mg/dL
Leukocytes,Ua: NEGATIVE
Nitrite: NEGATIVE
Protein, ur: NEGATIVE mg/dL
Specific Gravity, Urine: 1.019 (ref 1.005–1.030)
Squamous Epithelial / HPF: NONE SEEN (ref 0–5)
pH: 5 (ref 5.0–8.0)

## 2020-04-02 LAB — LACTIC ACID, PLASMA: Lactic Acid, Venous: 1.3 mmol/L (ref 0.5–1.9)

## 2020-04-02 LAB — RESPIRATORY PANEL BY RT PCR (FLU A&B, COVID)
Influenza A by PCR: NEGATIVE
Influenza B by PCR: NEGATIVE
SARS Coronavirus 2 by RT PCR: POSITIVE — AB

## 2020-04-02 LAB — TROPONIN I (HIGH SENSITIVITY): Troponin I (High Sensitivity): 6 ng/L (ref <18)

## 2020-04-02 MED ORDER — PREDNISONE 10 MG PO TABS: 10.0000 mg | ORAL_TABLET | Freq: Every day | ORAL | 0 refills | Status: DC

## 2020-04-02 MED ORDER — IBUPROFEN 600 MG PO TABS
600.0000 mg | ORAL_TABLET | Freq: Once | ORAL | Status: AC
  Administered 2020-04-02: 600 mg via ORAL
  Filled 2020-04-02: qty 1

## 2020-04-02 MED ORDER — SODIUM CHLORIDE 0.9% FLUSH: 3.0000 mL | Freq: Once | INTRAVENOUS | Status: DC

## 2020-04-02 MED ORDER — PSEUDOEPH-BROMPHEN-DM 30-2-10 MG/5ML PO SYRP: 10.0000 mL | ORAL_SOLUTION | Freq: Four times a day (QID) | ORAL | 0 refills | Status: DC | PRN

## 2020-04-02 MED ORDER — SODIUM CHLORIDE 0.9 % IV BOLUS
1000.0000 mL | Freq: Once | INTRAVENOUS | Status: AC
  Administered 2020-04-02: 1000 mL via INTRAVENOUS

## 2020-04-02 MED ORDER — ACETAMINOPHEN 325 MG PO TABS
650.0000 mg | ORAL_TABLET | Freq: Once | ORAL | Status: AC
  Administered 2020-04-02: 650 mg via ORAL
  Filled 2020-04-02: qty 2

## 2020-04-02 NOTE — ED Triage Notes (Signed)
Pt to ED via POV. [place] Pt had a near syncopal episode, EMS was called, pt did not go with EMS for evaluation but states that they did 2 blood test by sticking his finger. Pt states that he was told he needed to be seen. Pts SpO2 level in triage 91% on room air. Per [facility] nurse pts initial sats were 87% after walking in from parking lot. Pt is currently taking tamiflu for flu like symptoms but has not been diagnosed with flu.

## 2020-04-02 NOTE — ED Provider Notes (Signed)
[facility] Emergency Department Provider Note  ____________________________________________  Time seen: Approximately 5:05 PM  I have reviewed the triage vital signs and the nursing notes.   HISTORY  Chief Complaint Near Syncope    HPI Stanley Davis is a 39 y.o. male who presents the emergency department complaining of a near syncopal episode today.  Patient states that over the past several days he has felt unwell.  Patient was unsure whether he had the flu, allergies, cold.  Patient states that he was loading their care to come home when he became very lightheaded and dizzy.  Patient did not actually lose consciousness but states that he was going to pass out if he did not sit down.  Patient was evaluated by EMS, they requested that he be transported but patient did not want to be seen there and returned home with family and presents emergency department for evaluation.  Patient feels feverish, chills, like cough at nighttime.  He has been using over-the-counter medications without relief of symptoms.  Patient denies any headache, visual changes, chest pain, shortness of breath, abdominal pain, nausea or vomiting.         Past Medical History:  Diagnosis Date  . Anxiety   . Mild sleep apnea     Patient Active Problem List   Diagnosis Date Noted  . Anxiety disorder 05/23/2010  . Family history of cancer of digestive system 03/20/2009    Past Surgical History:  Procedure Laterality Date  . HERNIA REPAIR     AT AGE 50 and age 39   . VASECTOMY      Prior to Admission medications   Medication Sig Start Date End Date Taking? Authorizing Provider  amoxicillin (AMOXIL) 875 MG tablet Take 1 tablet (875 mg total) by mouth 2 (two) times daily. 09/08/17   Chrismon, Jodell Cipro, PA  brompheniramine-pseudoephedrine-DM 30-2-10 MG/5ML syrup Take 10 mLs by mouth 4 (four) times daily as needed. 04/02/20   Junella Domke, Delorise Royals, PA-C  cyclobenzaprine (FLEXERIL) 5 MG  tablet cyclobenzaprine 5 mg tablet  TAKE 1 TABLET BY MOUTH 3 TIMES A DAY AS NEEDED FOR MUSCLE SPASMS    [provider]  ibuprofen (ADVIL,MOTRIN) 800 MG tablet ibuprofen 800 mg tablet    [provider]  meloxicam (MOBIC) 15 MG tablet meloxicam 15 mg tablet  TAKE 1 TABLET BY MOUTH DAILY AS NEEDED FOR INFLAMMATORY PAIN    [provider]  predniSONE (DELTASONE) 10 MG tablet Take 1 tablet (10 mg total) by mouth daily. 04/02/20   Octavio Matheney, Delorise Royals, PA-C    Allergies Patient has no known allergies.  Family History  Problem Relation Age of Onset  . Liver cancer Mother   . Colon cancer Mother   . Healthy Sister   . Asthma Brother   . Bipolar disorder Brother   . Healthy Brother   . Diabetes Father   . Colon cancer Maternal Grandfather   . Prostate cancer Maternal Grandfather   . Kidney cancer Neg Hx   . Bladder Cancer Neg Hx     Social History Social History   Tobacco Use  . Smoking status: Never Smoker  . Smokeless tobacco: Never Used  Substance Use Topics  . Alcohol use: No    Alcohol/week: 0.0 standard drinks  . Drug use: No     Review of Systems  Constitutional: Subjective fever/chills.  Positive for body aches. Eyes: No visual changes. No discharge ENT: Nasal congestion, sinus pressure Cardiovascular: no chest pain. Respiratory: Mild  cough. No SOB. Gastrointestinal: No abdominal pain.  No nausea, no vomiting.  No diarrhea.  No constipation. Genitourinary: Negative for dysuria. No hematuria Musculoskeletal: Negative for musculoskeletal pain. Skin: Negative for rash, abrasions, lacerations, ecchymosis. Neurological: Near syncope.  Negative for headaches, focal weakness or numbness. 10-point ROS otherwise negative.  ____________________________________________   PHYSICAL EXAM:  VITAL SIGNS: ED Triage Vitals  Enc Vitals Group     BP 04/02/20 1427 (!) 147/75     Pulse Rate 04/02/20 1422 (!) 107     Resp 04/02/20 1422 (!) 22      Temp 04/02/20 1422 99.8 F (37.7 C)     Temp Source 04/02/20 1422 Oral     SpO2 04/02/20 1422 91 %     Weight 04/02/20 1427 (!) 315 lb (142.9 kg)     Height 04/02/20 1427 6\' 1"  (1.854 m)     Head Circumference --      Peak Flow --      Pain Score 04/02/20 1427 0     Pain Loc --      Pain Edu? --      Excl. in GC? --      Constitutional: Alert and oriented. Well appearing and in no acute distress. Eyes: Conjunctivae are normal. PERRL. EOMI. Head: Atraumatic. ENT:      Ears:       Nose: No congestion/rhinnorhea.      Mouth/Throat: Mucous membranes are moist.  Neck: No stridor.  Neck is supple full range of motion Hematological/Lymphatic/Immunilogical: No cervical lymphadenopathy.* Cardiovascular: Normal rate, regular rhythm. Normal S1 and S2.  Good peripheral circulation. Respiratory: Normal respiratory effort without tachypnea or retractions. Lungs CTAB. Good air entry to the bases with no decreased or absent breath sounds. Gastrointestinal: Bowel sounds 4 quadrants. Soft and nontender to palpation. No guarding or rigidity. No palpable masses. No distention. No CVA tenderness. Musculoskeletal: Full range of motion to all extremities. No gross deformities appreciated. Neurologic:  Normal speech and language. No gross focal neurologic deficits are appreciated.  Skin:  Skin is warm, dry and intact. No rash noted. Psychiatric: Mood and affect are normal. Speech and behavior are normal. Patient exhibits appropriate insight and judgement.   ____________________________________________   LABS (all labs ordered are listed, but only abnormal results are displayed)  Labs Reviewed  RESPIRATORY PANEL BY RT PCR (FLU A&B, COVID) - Abnormal; Notable for the following components:      Result Value   SARS Coronavirus 2 by RT PCR POSITIVE (*)    All other components within normal limits  URINALYSIS, COMPLETE (UACMP) WITH MICROSCOPIC - Abnormal; Notable for the following components:   Color,  Urine YELLOW (*)    APPearance HAZY (*)    Bacteria, UA RARE (*)    All other components within normal limits  CBC WITH DIFFERENTIAL/PLATELET - Abnormal; Notable for the following components:   Platelets 142 (*)    All other components within normal limits  COMPREHENSIVE METABOLIC PANEL - Abnormal; Notable for the following components:   Glucose, Bld 118 (*)    Calcium 8.6 (*)    AST 87 (*)    ALT 111 (*)    All other components within normal limits  LACTIC ACID, PLASMA  LACTIC ACID, PLASMA  CBG MONITORING, ED  TROPONIN I (HIGH SENSITIVITY)  TROPONIN I (HIGH SENSITIVITY)   ____________________________________________  EKG  ED ECG REPORT I, 04/04/20 Shuntell Foody,  personally viewed and interpreted this ECG.   Date: 04/02/2020  EKG Time: 1435 hrs.  Rate: 113 bpm  Rhythm: there are no previous tracings available for comparison, sinus tachycardia  Axis: Normal axis  Intervals:none  ST&T Change: No gross ST elevation or depression noted  Sinus tachycardia.  No STEMI.  ____________________________________________  RADIOLOGY I personally viewed and evaluated these images as part of my medical decision making, as well as reviewing the written report by the radiologist.  DG Chest 2 View  Result Date: 04/02/2020 CLINICAL DATA:  Fever, cough. EXAM: CHEST - 2 VIEW COMPARISON:  None. FINDINGS: The heart size and mediastinal contours are within normal limits. Both lungs are clear. The visualized skeletal structures are unremarkable. IMPRESSION: No active cardiopulmonary disease. Electronically Signed   By: Marijo Conception M.D.   On: 04/02/2020 16:05    ____________________________________________    PROCEDURES  Procedure(s) performed:    Procedures    Medications  sodium chloride flush (NS) 0.9 % injection 3 mL (3 mLs Intravenous Not Given 04/02/20 1511)  sodium chloride 0.9 % bolus 1,000 mL (0 mLs Intravenous Stopped 04/02/20 1710)  acetaminophen (TYLENOL) tablet 650 mg  (650 mg Oral Given 04/02/20 1524)  ibuprofen (ADVIL) tablet 600 mg (600 mg Oral Given 04/02/20 1524)     ____________________________________________   INITIAL IMPRESSION / ASSESSMENT AND PLAN / ED COURSE  Pertinent labs & imaging results that were available during my care of the patient were reviewed by me and considered in my medical decision making (see chart for details).  Review of the [place] CSRS was performed in accordance of the [place] prior to dispensing any controlled drugs.           Patient's diagnosis is consistent with COVID-19, near syncope.  Patient presented to the emergency department complaining of near syncopal episode.  Patient states that he has had flulike illness for the past several days.  Patient states that he is still eating and drinking though he does have a decreased appetite.  Patient ate breakfast this morning, was packing up in the heat when he felt lightheaded felt like he was going to pass out.  Patient did not actually have a syncopal episode.  Patient was initially evaluated by EMS but refused transport at the time.  Patient presents for evaluation.  He was febrile and tachycardic.  Given patient's symptoms I was concerned for COVID-19 versus cardiac versus other infectious process.  Overall patient's labs are reassuring.  Chest x-ray is reassuring.  Patient's history plus a positive Covid swab is consistent with COVID-19.  Patient is otherwise stable, feels improved at this time.  Patient stable for discharge.  Patient is given ED precautions to return to the ED for any worsening or new symptoms.     ____________________________________________  FINAL CLINICAL IMPRESSION(S) / ED DIAGNOSES  Final diagnoses:  COVID-19      NEW MEDICATIONS STARTED DURING THIS VISIT:  ED Discharge Orders         Ordered    brompheniramine-pseudoephedrine-DM 30-2-10 MG/5ML syrup  4 times daily PRN     04/02/20 1728    predniSONE (DELTASONE) 10 MG tablet  Daily    Note  to Pharmacy: Take 6 pills x 2 days, 5 pills x 2 days, 4 pills x 2 days, 3 pills x 2 days, 2 pills x 2 days, and 1 pill x 2 days   04/02/20 1728              This chart was dictated using voice recognition software/Dragon. Despite best efforts to proofread, errors can occur which can change  the meaning. Any change was purely unintentional.    Racheal Patches, PA-C 04/02/20 1730    Charlynne Pander, MD 04/02/20 2015

## 2020-04-03 ENCOUNTER — Encounter: Payer: Self-pay | Admitting: Physician Assistant

## 2020-04-03 ENCOUNTER — Telehealth: Payer: Self-pay | Admitting: Physician Assistant

## 2020-04-03 NOTE — Telephone Encounter (Signed)
Called to discuss with Stanley Davis about Covid symptoms and the use of bamlanivimab/etesevimab or casirivimab/imdevimab, a monoclonal antibody infusion for those with mild to moderate Covid symptoms and at a high risk of hospitalization.     Pt is qualified for this infusion at the [facility] infusion center due to co-morbid conditions and/or a member of an at-risk group, however would like to think about infusion at this time. Symptoms tier reviewed as well as criteria for ending isolation.  Symptoms reviewed that would warrant ED/Hospital evaluation. Preventative practices reviewed. Patient verbalized understanding. Patient advised to call back if he decides that he does want to get infusion. Callback number to the infusion center given. Patient advised to go to Urgent care or ED with severe symptoms. Last date pt would be eligible for infusion is 4/25.     Patient Active Problem List   Diagnosis Date Noted  . Morbid obesity (HCC)   . Anxiety disorder 05/23/2010  . Family history of cancer of digestive system 03/20/2009    Cline Crock PA-C

## 2020-04-06 DIAGNOSIS — J1282 Pneumonia due to coronavirus disease 2019: Secondary | ICD-10-CM | POA: Insufficient documentation

## 2020-04-06 DIAGNOSIS — J9601 Acute respiratory failure with hypoxia: Secondary | ICD-10-CM | POA: Insufficient documentation

## 2023-09-01 ENCOUNTER — Telehealth: Payer: Self-pay

## 2023-09-01 NOTE — Telephone Encounter (Signed)
Patient called in schedule appointment. There is no referral in the que.

## 2023-09-02 ENCOUNTER — Telehealth: Payer: Self-pay

## 2023-09-02 ENCOUNTER — Other Ambulatory Visit: Payer: Self-pay

## 2023-09-02 DIAGNOSIS — Z1211 Encounter for screening for malignant neoplasm of colon: Secondary | ICD-10-CM

## 2023-09-02 DIAGNOSIS — Z800 Family history of malignant neoplasm of digestive organs: Secondary | ICD-10-CM

## 2023-09-02 MED ORDER — NA SULFATE-K SULFATE-MG SULF 17.5-3.13-1.6 GM/177ML PO SOLN: 1.0000 | Freq: Once | ORAL | 0 refills | Status: AC

## 2023-09-02 NOTE — Telephone Encounter (Signed)
Patient called me back left a voicemail and I contacted the patient back to verify and update the information in his chart, including the insurance guarantor and other relevant details. Patient is now ready to schedule his colonoscopy.

## 2023-09-02 NOTE — Telephone Encounter (Signed)
Gastroenterology Pre-Procedure Review  Request Date: 10/17/23 Requesting Physician: Dr. Tobi Bastos  PATIENT REVIEW QUESTIONS: The patient responded to the following health history questions as indicated:    1. Are you having any GI issues? no 2. Do you have a personal history of Polyps? no 3. Do you have a family history of Colon Cancer or Polyps? yes (Mother and grandfather colon cancer) 4. Diabetes Mellitus? no 5. Joint replacements in the past 12 months?no 6. Major health problems in the past 3 months?no 7. Any artificial heart valves, MVP, or defibrillator?no    MEDICATIONS & ALLERGIES:    Patient reports the following regarding taking any anticoagulation/antiplatelet therapy:   Plavix, Coumadin, Eliquis, Xarelto, Lovenox, Pradaxa, Brilinta, or Effient? no Aspirin? no  Patient confirms/reports the following medications:  Current Outpatient Medications  Medication Sig Dispense Refill   testosterone cypionate (DEPOTESTOSTERONE CYPIONATE) 200 MG/ML injection every 28 (twenty-eight) days.     No current facility-administered medications for this visit.    Patient confirms/reports the following allergies:  No Known Allergies  No orders of the defined types were placed in this encounter.   AUTHORIZATION INFORMATION Primary Insurance: 1D#: Group #:  Secondary Insurance: 1D#: Group #:  SCHEDULE INFORMATION: Date: 10/17/23 Time: Location: [facility]

## 2023-10-10 ENCOUNTER — Encounter: Payer: Self-pay | Admitting: Gastroenterology

## 2023-10-17 ENCOUNTER — Ambulatory Visit
Admission: RE | Admit: 2023-10-17 | Discharge: 2023-10-17 | Disposition: A | Discharge status: Home / Self Care | Payer: No Typology Code available for payment source | Attending: Gastroenterology | Admitting: Gastroenterology

## 2023-10-17 ENCOUNTER — Ambulatory Visit: Payer: No Typology Code available for payment source | Admitting: Anesthesiology

## 2023-10-17 ENCOUNTER — Encounter
Admission: RE | Disposition: A | Discharge status: Home / Self Care | Payer: Self-pay | Source: Home / Self Care | Attending: Gastroenterology

## 2023-10-17 ENCOUNTER — Encounter: Payer: Self-pay | Admitting: Gastroenterology

## 2023-10-17 DIAGNOSIS — Z5986 Financial insecurity: Secondary | ICD-10-CM | POA: Diagnosis not present

## 2023-10-17 DIAGNOSIS — K621 Rectal polyp: Secondary | ICD-10-CM | POA: Insufficient documentation

## 2023-10-17 DIAGNOSIS — G4730 Sleep apnea, unspecified: Secondary | ICD-10-CM | POA: Diagnosis not present

## 2023-10-17 DIAGNOSIS — D125 Benign neoplasm of sigmoid colon: Secondary | ICD-10-CM | POA: Diagnosis not present

## 2023-10-17 DIAGNOSIS — Z1211 Encounter for screening for malignant neoplasm of colon: Secondary | ICD-10-CM | POA: Diagnosis not present

## 2023-10-17 DIAGNOSIS — Z800 Family history of malignant neoplasm of digestive organs: Secondary | ICD-10-CM

## 2023-10-17 DIAGNOSIS — K635 Polyp of colon: Secondary | ICD-10-CM | POA: Diagnosis not present

## 2023-10-17 DIAGNOSIS — D122 Benign neoplasm of ascending colon: Secondary | ICD-10-CM | POA: Diagnosis not present

## 2023-10-17 DIAGNOSIS — D126 Benign neoplasm of colon, unspecified: Secondary | ICD-10-CM

## 2023-10-17 HISTORY — PX: POLYPECTOMY: SHX5525

## 2023-10-17 HISTORY — PX: COLONOSCOPY WITH PROPOFOL: SHX5780

## 2023-10-17 SURGERY — COLONOSCOPY WITH PROPOFOL
Anesthesia: General

## 2023-10-17 MED ORDER — PROPOFOL 500 MG/50ML IV EMUL
INTRAVENOUS | Status: DC | PRN
  Administered 2023-10-17: 200 ug/kg/min via INTRAVENOUS
  Administered 2023-10-17: 150 mg via INTRAVENOUS

## 2023-10-17 MED ORDER — SODIUM CHLORIDE 0.9 % IV SOLN
INTRAVENOUS | Status: DC
  Administered 2023-10-17: 20 mL/h via INTRAVENOUS

## 2023-10-17 MED ORDER — PROPOFOL 1000 MG/100ML IV EMUL
INTRAVENOUS | Status: AC
  Filled 2023-10-17: qty 100

## 2023-10-17 NOTE — H&P (Signed)
Stanley Mood, MD [address], [address], [place], [place], [address] [address], [address], [place], [place], [address] Phone: [phone]  Fax: [phone]  Primary Care Physician:  Bary Leriche, MD   Pre-Procedure History & Physical: HPI:  Stanley Davis is a 42 y.o. male is here for an colonoscopy.   Past Medical History:  Diagnosis Date   Anxiety    Mild sleep apnea    Morbid obesity (HCC)     Past Surgical History:  Procedure Laterality Date   HERNIA REPAIR     AT AGE 73 and age 21    VASECTOMY      Prior to Admission medications   Medication Sig Start Date End Date Taking? Authorizing Provider  testosterone cypionate (DEPOTESTOSTERONE CYPIONATE) 200 MG/ML injection every 28 (twenty-eight) days.    [provider]    Allergies as of 09/02/2023   (No Known Allergies)    Family History  Problem Relation Age of Onset   Liver cancer Mother    Colon cancer Mother    Healthy Sister    Asthma Brother    Bipolar disorder Brother    Healthy Brother    Diabetes Father    Colon cancer Maternal Grandfather    Prostate cancer Maternal Grandfather    Kidney cancer Neg Hx    Bladder Cancer Neg Hx     Social History   Socioeconomic History   Marital status: Married    Spouse name: Not on file   Number of children: Not on file   Years of education: Not on file   Highest education level: Not on file  Occupational History   Not on file  Tobacco Use   Smoking status: Never   Smokeless tobacco: Never  Substance and Sexual Activity   Alcohol use: No    Alcohol/week: 0.0 standard drinks of alcohol   Drug use: No   Sexual activity: Not on file  Other Topics Concern   Not on file  Social History Narrative   Not on file   Social Determinants of Health   Financial Resource Strain: Medium Risk (04/07/2020)   Received from [facility]   Overall Financial Resource Strain (CARDIA)    Difficulty of Paying Living Expenses: Somewhat  hard  Food Insecurity: No Food Insecurity (04/07/2020)   Received from [facility]   Hunger Vital Sign    Worried About Running Out of Food in the Last Year: Never true    Ran Out of Food in the Last Year: Never true  Transportation Needs: No Transportation Needs (04/07/2020)   Received from [facility]   PRAPARE - Transportation    Lack of Transportation (Medical): No    Lack of Transportation (Non-Medical): No  Physical Activity: Not on file  Stress: Not on file  Social Connections: Not on file  Intimate Partner Violence: Not on file    Review of Systems: See HPI, otherwise negative ROS  Physical Exam: BP (!) 163/107   Pulse 84   Temp (!) 96.8 F (36 C) (Temporal)   Resp 20   Ht 6\' 1"  (1.854 m)   Wt (!) 137.5 kg   SpO2 97%   BMI 40.00 kg/m  General:   Alert,  pleasant and cooperative in NAD Head:  Normocephalic and atraumatic. Neck:  Supple; no masses or thyromegaly. Lungs:  Clear throughout to auscultation, normal respiratory effort.    Heart:  +S1, +S2, Regular rate and rhythm, No edema. Abdomen:  Soft, nontender and nondistended. Normal bowel sounds, without guarding, and without rebound.   Neurologic:  Alert and  oriented x4;  grossly normal neurologically.  Impression/Plan: Stanley Davis is here for an colonoscopy to be performed for Screening colonoscopy colon cancer in parents.  Risks, benefits, limitations, and alternatives regarding  colonoscopy have been reviewed with the patient.  Questions have been answered.  All parties agreeable.   Stanley Mood, MD  10/17/2023, 8:18 AM

## 2023-10-17 NOTE — Anesthesia Preprocedure Evaluation (Signed)
Anesthesia Evaluation  Patient identified by MRN, date of birth, ID band Patient awake    Reviewed: Allergy & Precautions, H&P , NPO status , Patient's Chart, lab work & pertinent test results  Airway Mallampati: III  TM Distance: >3 FB Neck ROM: full    Dental no notable dental hx.    Pulmonary sleep apnea    Pulmonary exam normal        Cardiovascular negative cardio ROS Normal cardiovascular exam     Neuro/Psych  PSYCHIATRIC DISORDERS      negative neurological ROS     GI/Hepatic negative GI ROS, Neg liver ROS,,,  Endo/Other    Renal/GU negative Renal ROS  negative genitourinary   Musculoskeletal   Abdominal  (+) + obese  Peds  Hematology negative hematology ROS (+)   Anesthesia Other Findings Past Medical History: No date: Anxiety No date: Mild sleep apnea No date: Morbid obesity (HCC)  Past Surgical History: No date: HERNIA REPAIR     Comment:  AT AGE 42 and age 71  No date: VASECTOMY  BMI    Body Mass Index: 40.00 kg/m      Reproductive/Obstetrics negative OB ROS                              Anesthesia Physical Anesthesia Plan  ASA: 2  Anesthesia Plan: General   Post-op Pain Management:    Induction: Intravenous  PONV Risk Score and Plan: Propofol infusion and TIVA  Airway Management Planned:   Additional Equipment:   Intra-op Plan:   Post-operative Plan:   Informed Consent: I have reviewed the patients History and Physical, chart, labs and discussed the procedure including the risks, benefits and alternatives for the proposed anesthesia with the patient or authorized representative who has indicated his/her understanding and acceptance.     Dental Advisory Given  Plan Discussed with: CRNA and Surgeon  Anesthesia Plan Comments:          Anesthesia Quick Evaluation

## 2023-10-17 NOTE — Anesthesia Postprocedure Evaluation (Signed)
Anesthesia Post Note  Patient: Stanley Davis  Procedure(s) Performed: COLONOSCOPY WITH PROPOFOL POLYPECTOMY  Patient location during evaluation: Endoscopy Anesthesia Type: General Level of consciousness: awake and alert Pain management: pain level controlled Vital Signs Assessment: post-procedure vital signs reviewed and stable Respiratory status: spontaneous breathing, nonlabored ventilation and respiratory function stable Cardiovascular status: blood pressure returned to baseline and stable Postop Assessment: no apparent nausea or vomiting Anesthetic complications: no   There were no known notable events for this encounter.   Last Vitals:  Vitals:   10/17/23 0901 10/17/23 0911  BP: (!) 149/81 137/72  Pulse: 87 80  Resp: 18 19  Temp:    SpO2: 96% 97%    Last Pain:  Vitals:   10/17/23 0911  TempSrc:   PainSc: 0-No pain                 Foye Deer

## 2023-10-17 NOTE — Transfer of Care (Signed)
Immediate Anesthesia Transfer of Care Note  Patient: Stanley Davis  Procedure(s) Performed: COLONOSCOPY WITH PROPOFOL POLYPECTOMY  Patient Location: PACU  Anesthesia Type:General  Level of Consciousness: awake  Airway & Oxygen Therapy: Patient Spontanous Breathing  Post-op Assessment: Report given to RN and Post -op Vital signs reviewed and stable  Post vital signs: Reviewed and stable  Last Vitals:  Vitals Value Taken Time  BP 108/47 10/17/23 0851  Temp    Pulse 87 10/17/23 0851  Resp 18 10/17/23 0851  SpO2 96 % 10/17/23 0851  Vitals shown include unfiled device data.  Last Pain:  Vitals:   10/17/23 0851  TempSrc:   PainSc: 0-No pain         Complications: There were no known notable events for this encounter.

## 2023-10-17 NOTE — Op Note (Signed)
[facility] Gastroenterology Patient Name: Stanley Davis Procedure Date: 10/17/2023 8:21 AM MRN: [id number] Account #: [id number] Date of Birth: [birth year] Admit Type: Outpatient Age: 42 Room: [facility] ENDO ROOM 4 Gender: Male Note Status: Finalized Instrument Name: Prentice Docker [id number] Procedure:             Colonoscopy Indications:           Screening in patient at increased risk: Colorectal                         cancer in mother before age 67 Providers:             Wyline Mood MD, MD Referring MD:          Cecille Amsterdam. Mayford Knife, MD (Referring MD) Medicines:             Monitored Anesthesia Care Complications:         No immediate complications. Procedure:             Pre-Anesthesia Assessment:                        - Prior to the procedure, a History and Physical was                         performed, and patient medications, allergies and                         sensitivities were reviewed. The patient's tolerance                         of previous anesthesia was reviewed.                        - The risks and benefits of the procedure and the                         sedation options and risks were discussed with the                         patient. All questions were answered and informed                         consent was obtained.                        - ASA Grade Assessment: II - A patient with mild                         systemic disease.                        After obtaining informed consent, the colonoscope was                         passed under direct vision. Throughout the procedure,                         the patient's blood pressure, pulse, and oxygen  saturations were monitored continuously. The                         Colonoscope was introduced through the anus and                         advanced to the the cecum, identified by the                         appendiceal orifice. The colonoscopy was performed                          with ease. The patient tolerated the procedure well.                         The quality of the bowel preparation was excellent.                         The ileocecal valve, appendiceal orifice, and rectum                         were photographed. Findings:      The perianal and digital rectal examinations were normal.      Two sessile polyps were found in the ascending colon. The polyps were 4       to 5 mm in size. These polyps were removed with a cold snare. Resection       and retrieval were complete.      Two sessile polyps were found in the rectum and sigmoid colon. The       polyps were 4 to 5 mm in size. These polyps were removed with a cold       snare. Resection and retrieval were complete.      The exam was otherwise without abnormality on direct and retroflexion       views. Impression:            - Two 4 to 5 mm polyps in the ascending colon, removed                         with a cold snare. Resected and retrieved.                        - Two 4 to 5 mm polyps in the rectum and in the                         sigmoid colon, removed with a cold snare. Resected and                         retrieved.                        - The examination was otherwise normal on direct and                         retroflexion views. Recommendation:        - Discharge patient to home (with escort).                        -  Resume previous diet.                        - Continue present medications.                        - Await pathology results.                        - Repeat colonoscopy in 3 - 5 years for surveillance. Procedure Code(s):     --- Professional ---                        [phone], Colonoscopy, flexible; with removal of                         tumor(s), polyp(s), or other lesion(s) by snare                         technique Diagnosis Code(s):     --- Professional ---                        Z80.0, Family history of malignant neoplasm of                          digestive organs                        D12.2, Benign neoplasm of ascending colon                        D12.8, Benign neoplasm of rectum                        D12.5, Benign neoplasm of sigmoid colon CPT copyright 2022 [employer]. All rights reserved. The codes documented in this report are preliminary and upon coder review may  be revised to meet current compliance requirements. Wyline Mood, MD Wyline Mood MD, MD 10/17/2023 8:49:17 AM This report has been signed electronically. Number of Addenda: 0 Note Initiated On: 10/17/2023 8:21 AM Scope Withdrawal Time: 0 hours 15 minutes 24 seconds  Total Procedure Duration: 0 hours 16 minutes 40 seconds  Estimated Blood Loss:  Estimated blood loss: none.      [facility]

## 2023-10-20 ENCOUNTER — Encounter: Payer: Self-pay | Admitting: Gastroenterology

## 2023-10-20 LAB — SURGICAL PATHOLOGY

## 2023-12-08 ENCOUNTER — Other Ambulatory Visit: Payer: Self-pay

## 2023-12-08 DIAGNOSIS — N179 Acute kidney failure, unspecified: Secondary | ICD-10-CM | POA: Insufficient documentation

## 2023-12-08 DIAGNOSIS — D49511 Neoplasm of unspecified behavior of right kidney: Secondary | ICD-10-CM | POA: Diagnosis not present

## 2023-12-08 DIAGNOSIS — D72829 Elevated white blood cell count, unspecified: Secondary | ICD-10-CM | POA: Diagnosis not present

## 2023-12-08 DIAGNOSIS — R109 Unspecified abdominal pain: Secondary | ICD-10-CM | POA: Diagnosis present

## 2023-12-08 DIAGNOSIS — N281 Cyst of kidney, acquired: Secondary | ICD-10-CM | POA: Diagnosis not present

## 2023-12-08 NOTE — ED Triage Notes (Signed)
Pt to ED via POV c/o RLQ abd pain x8days, pt says it is now radiating to lowerback. Pt endorses some nausea. Pt also states he noticed some black stool today, reports he took pepto bismol yesterday and this was one of the side effects. Pt has been taking 800mg  ibuprofen for pain everyday for pain with relief

## 2023-12-09 ENCOUNTER — Emergency Department: Payer: No Typology Code available for payment source

## 2023-12-09 ENCOUNTER — Emergency Department
Admission: EM | Admit: 2023-12-09 | Discharge: 2023-12-09 | Disposition: A | Discharge status: Home / Self Care | Payer: No Typology Code available for payment source | Attending: Emergency Medicine | Admitting: Emergency Medicine

## 2023-12-09 DIAGNOSIS — D49519 Neoplasm of unspecified behavior of unspecified kidney: Secondary | ICD-10-CM

## 2023-12-09 DIAGNOSIS — R109 Unspecified abdominal pain: Secondary | ICD-10-CM

## 2023-12-09 DIAGNOSIS — N281 Cyst of kidney, acquired: Secondary | ICD-10-CM

## 2023-12-09 LAB — COMPREHENSIVE METABOLIC PANEL
ALT: 93 U/L — ABNORMAL HIGH (ref 0–44)
AST: 55 U/L — ABNORMAL HIGH (ref 15–41)
Albumin: 4.1 g/dL (ref 3.5–5.0)
Alkaline Phosphatase: 83 U/L (ref 38–126)
Anion gap: 8 (ref 5–15)
BUN: 13 mg/dL (ref 6–20)
CO2: 27 mmol/L (ref 22–32)
Calcium: 9.2 mg/dL (ref 8.9–10.3)
Chloride: 102 mmol/L (ref 98–111)
Creatinine, Ser: 1.31 mg/dL — ABNORMAL HIGH (ref 0.61–1.24)
GFR, Estimated: >60 mL/min (ref >60)
Glucose, Bld: 126 mg/dL — ABNORMAL HIGH (ref 70–99)
Potassium: 4 mmol/L (ref 3.5–5.1)
Sodium: 137 mmol/L (ref 135–145)
Total Bilirubin: 0.7 mg/dL (ref <1.2)
Total Protein: 7.8 g/dL (ref 6.5–8.1)

## 2023-12-09 LAB — URINALYSIS, ROUTINE W REFLEX MICROSCOPIC
Bacteria, UA: NONE SEEN
Bilirubin Urine: NEGATIVE
Glucose, UA: NEGATIVE mg/dL
Ketones, ur: NEGATIVE mg/dL
Leukocytes,Ua: NEGATIVE
Nitrite: NEGATIVE
Protein, ur: NEGATIVE mg/dL
Specific Gravity, Urine: 1.017 (ref 1.005–1.030)
pH: 6 (ref 5.0–8.0)

## 2023-12-09 LAB — TYPE AND SCREEN
ABO/RH(D): A POS
Antibody Screen: NEGATIVE

## 2023-12-09 LAB — CBC
HCT: 52.7 % — ABNORMAL HIGH (ref 39.0–52.0)
Hemoglobin: 17.5 g/dL — ABNORMAL HIGH (ref 13.0–17.0)
MCH: 29.3 pg (ref 26.0–34.0)
MCHC: 33.2 g/dL (ref 30.0–36.0)
MCV: 88.1 fL (ref 80.0–100.0)
Platelets: 302 10*3/uL (ref 150–400)
RBC: 5.98 MIL/uL — ABNORMAL HIGH (ref 4.22–5.81)
RDW: 13.1 % (ref 11.5–15.5)
WBC: 11.5 10*3/uL — ABNORMAL HIGH (ref 4.0–10.5)
nRBC: 0 % (ref 0.0–0.2)

## 2023-12-09 LAB — LIPASE, BLOOD: Lipase: 38 U/L (ref 11–51)

## 2023-12-09 MED ORDER — MORPHINE SULFATE (PF) 4 MG/ML IV SOLN
4.0000 mg | Freq: Once | INTRAVENOUS | Status: DC
Start: 2023-12-09 — End: 2023-12-09
  Administered 2023-12-09: 4 mg via INTRAVENOUS
  Filled 2023-12-09: qty 1

## 2023-12-09 MED ORDER — ONDANSETRON HCL 4 MG/2ML IJ SOLN
4.0000 mg | Freq: Once | INTRAMUSCULAR | Status: AC
  Administered 2023-12-09: 4 mg via INTRAVENOUS
  Filled 2023-12-09: qty 2

## 2023-12-09 MED ORDER — KETOROLAC TROMETHAMINE 30 MG/ML IJ SOLN: 15.0000 mg | Freq: Once | INTRAMUSCULAR | Status: DC

## 2023-12-09 MED ORDER — SODIUM CHLORIDE 0.9 % IV BOLUS
1000.0000 mL | Freq: Once | INTRAVENOUS | Status: AC
  Administered 2023-12-09: 1000 mL via INTRAVENOUS

## 2023-12-09 MED ORDER — HYDROCODONE-ACETAMINOPHEN 5-325 MG PO TABS: 1.0000 | ORAL_TABLET | Freq: Four times a day (QID) | ORAL | 0 refills | Status: DC | PRN

## 2023-12-09 MED ORDER — IOHEXOL 350 MG/ML SOLN
100.0000 mL | Freq: Once | INTRAVENOUS | Status: AC | PRN
  Administered 2023-12-09: 100 mL via INTRAVENOUS

## 2023-12-09 NOTE — Discharge Instructions (Addendum)
You may take pain medicine as needed.  Drink plenty of fluids daily.  Return to the ER for worsening symptoms, persistent vomiting, difficulty breathing or other concerns.

## 2023-12-09 NOTE — ED Provider Notes (Signed)
[facility] Provider Note    Event Date/Time   First MD Initiated Contact with Patient 12/09/23 [phone]     (approximate)   History   Abdominal Pain   HPI  Stanley Davis is a 42 y.o. male who presents to the ED from home with a chief complaint of right flank/abdominal pain waxing/waning x 8 days.  Endorses some associated nausea.  Took Pepto-Bismol 2 days ago and his neck stool was black.  Has been taking over-the-counter ibuprofen for pain with partial relief of symptoms.  Denies associated fever/chills, chest pain, shortness of breath, vomiting, hematuria, diarrhea, testicular pain or swelling.     Past Medical History   Past Medical History:  Diagnosis Date   Anxiety    Mild sleep apnea    Morbid obesity [facility])      Active Problem List   Patient Active Problem List   Diagnosis Date Noted   Family history of colon cancer 10/17/2023   Adenomatous polyp of colon 10/17/2023   Acute hypoxemic respiratory failure due to severe acute respiratory syndrome coronavirus 2 (SARS-CoV-2) disease (HCC) 04/06/2020   Morbid obesity with BMI of 40.0-44.9, adult (HCC) 04/06/2020   Pneumonia due to COVID-19 virus 04/06/2020   Morbid obesity (HCC)    Anxiety disorder 05/23/2010   Family history of cancer of digestive system 03/20/2009     Past Surgical History   Past Surgical History:  Procedure Laterality Date   COLONOSCOPY WITH PROPOFOL N/A 10/17/2023   Procedure: COLONOSCOPY WITH PROPOFOL;  Surgeon: Wyline Mood, MD;  Location: [facility] ENDOSCOPY;  Service: Gastroenterology;  Laterality: N/A;   HERNIA REPAIR     AT AGE 67 and age 75    POLYPECTOMY  10/17/2023   Procedure: POLYPECTOMY;  Surgeon: Wyline Mood, MD;  Location: [facility] ENDOSCOPY;  Service: Gastroenterology;;   VASECTOMY       Home Medications   Prior to Admission medications   Medication Sig Start Date End Date Taking? Authorizing Provider  HYDROcodone-acetaminophen (NORCO) 5-325 MG tablet Take 1  tablet by mouth every 6 (six) hours as needed for moderate pain (pain score 4-6). 12/09/23  Yes Irean Hong, MD  testosterone cypionate (DEPOTESTOSTERONE CYPIONATE) 200 MG/ML injection every 28 (twenty-eight) days.    [provider]     Allergies  Patient has no known allergies.   Family History   Family History  Problem Relation Age of Onset   Liver cancer Mother    Colon cancer Mother    Healthy Sister    Asthma Brother    Bipolar disorder Brother    Healthy Brother    Diabetes Father    Colon cancer Maternal Grandfather    Prostate cancer Maternal Grandfather    Kidney cancer Neg Hx    Bladder Cancer Neg Hx      Physical Exam  Triage Vital Signs: ED Triage Vitals  Encounter Vitals Group     BP 12/08/23 2347 (!) 186/110     Systolic BP Percentile --      Diastolic BP Percentile --      Pulse Rate 12/08/23 2347 (!) 104     Resp 12/08/23 2347 18     Temp 12/08/23 2347 98.2 F (36.8 C)     Temp Source 12/08/23 2347 Oral     SpO2 --      Weight 12/08/23 2346 300 lb (136.1 kg)     Height 12/08/23 2346 6\' 2"  (1.88 m)     Head Circumference --  Peak Flow --      Pain Score 12/08/23 2345 6     Pain Loc --      Pain Education --      Exclude from Growth Chart --     Updated Vital Signs: BP (!) 160/106   Pulse (!) 105   Temp 98.1 F (36.7 C)   Resp 17   Ht 6\' 2"  (1.88 m)   Wt 136.1 kg   SpO2 95%   BMI 38.52 kg/m    General: Awake, mild distress.  CV:  RRR.  Good peripheral perfusion.  Resp:  Normal effort.  CTAB. Abd:  Mild right lower quadrant tenderness to palpation without rebound or guarding.  Mild right CVAT.  No distention.  Other:  No truncal vesicles.   ED Results / Procedures / Treatments  Labs (all labs ordered are listed, but only abnormal results are displayed) Labs Reviewed  COMPREHENSIVE METABOLIC PANEL - Abnormal; Notable for the following components:      Result Value   Glucose, Bld 126 (*)    Creatinine, Ser 1.31 (*)     AST 55 (*)    ALT 93 (*)    All other components within normal limits  CBC - Abnormal; Notable for the following components:   WBC 11.5 (*)    RBC 5.98 (*)    Hemoglobin 17.5 (*)    HCT 52.7 (*)    All other components within normal limits  URINALYSIS, ROUTINE W REFLEX MICROSCOPIC - Abnormal; Notable for the following components:   Color, Urine YELLOW (*)    APPearance CLEAR (*)    Hgb urine dipstick SMALL (*)    All other components within normal limits  LIPASE, BLOOD  POC OCCULT BLOOD, ED  TYPE AND SCREEN     EKG  None   RADIOLOGY I have independently visualized and interpreted patient's imaging study as well as noted the radiology interpretation:  CT abdomen/pelvis: CT abdomen/is: Renal cysts, complex cyst recommend ultrasound  Renal ultrasound: Lesion concerning for neoplasm  Official radiology report(s): US Renal Result Date: 12/09/2023 CLINICAL DATA:  42 year old male with history of renal cyst. Right lower quadrant abdominal pain for the past 8 days. EXAM: RENAL / URINARY TRACT ULTRASOUND COMPLETE COMPARISON:  No prior abdominal ultrasound. CT of the abdomen and pelvis 12/09/2023. FINDINGS: Right Kidney: Renal measurements: 12.6 x 7.7 x 6.6 cm = volume: 334.4 mL. Echogenicity within normal limits. Complex lesion in the central aspect of the lower pole measuring 2.5 x 3.5 x 2.3 cm demonstrating heterogeneous internal echogenicity with what appears to be thick internal septations and/or internal soft tissue nodularity. Other smaller lesions are also noted generally anechoic with increased through transmission, measuring up to 1.8 x 1.5 x 2.3 cm in the upper pole, compatible with cysts. Left Kidney: Renal measurements: 13.3 x 6.2 x 6.5 cm = volume: 278.9 mL. Echogenicity within normal limits. No mass or hydronephrosis visualized. Bladder: Appears normal for degree of bladder distention. Other: None. IMPRESSION: 1. The lesion of concern in the lower pole of the right kidney  has imaging characteristics concerning for probable cystic renal neoplasm. This could be definitively characterized with follow-up nonemergent outpatient abdominal MRI with and without IV gadolinium. Outpatient referral to Urology for further clinical evaluation is also recommended. Electronically Signed   By: Trudie Reed M.D.   On: 12/09/2023 06:17   CT ABDOMEN PELVIS W CONTRAST Result Date: 12/09/2023 CLINICAL DATA:  Right lower quadrant pain radiating to the lower back. EXAM: CT  ABDOMEN AND PELVIS WITH CONTRAST TECHNIQUE: Multidetector CT imaging of the abdomen and pelvis was performed using the standard protocol following bolus administration of intravenous contrast. RADIATION DOSE REDUCTION: This exam was performed according to the departmental dose-optimization program which includes automated exposure control, adjustment of the mA and/or kV according to patient size and/or use of iterative reconstruction technique. CONTRAST:  OMNIPAQUE IOHEXOL 350 MG/ML SOLN COMPARISON:  April 02, 2016 FINDINGS: Lower chest: No acute abnormality. Hepatobiliary: No focal liver abnormality is seen. No gallstones, gallbladder wall thickening, or biliary dilatation. Pancreas: Unremarkable. No pancreatic ductal dilatation or surrounding inflammatory changes. Spleen: Normal in size without focal abnormality. Adrenals/Urinary Tract: Adrenal glands are unremarkable. Kidneys are normal in size, without renal calculi or hydronephrosis. A 2.1 cm x 3.7 cm x 2.6 cm complex appearing cystic structure (approximately 11.42 Hounsfield units) is seen within the mid right kidney (measured 1.1 cm x 1.8 cm x 1.0 cm on the prior exam). A trace amount of perinephric inflammatory fat stranding is seen this level of the right kidney. A stable 0.8 cm diameter simple cyst is seen within the upper pole of the right kidney. Bladder is unremarkable. Stomach/Bowel: Stomach is within normal limits. Appendix appears normal. No evidence of  bowel wall thickening, distention, or inflammatory changes. Vascular/Lymphatic: No significant vascular findings are present. No enlarged abdominal or pelvic lymph nodes. Reproductive: Prostate is unremarkable. Other: No abdominal wall hernia or abnormality. No abdominopelvic ascites. Musculoskeletal: No acute or significant osseous findings. IMPRESSION: 1. Enlarging complex appearing cystic structure within the mid right kidney, as described above. An infected renal cyst cannot be excluded. Correlation with urinalysis and nonemergent renal ultrasound is recommended. 2. Stable simple cyst within the upper pole of the right kidney. 3. Normal appendix. Electronically Signed   By: Aram Candela M.D.   On: 12/09/2023 03:59     PROCEDURES:  Critical Care performed: No  .1-3 Lead EKG Interpretation  Performed by: Irean Hong, MD Authorized by: Irean Hong, MD     Interpretation: normal     ECG rate:  90   ECG rate assessment: normal     Rhythm: sinus rhythm     Ectopy: none     Conduction: normal   Comments:     Patient placed on cardiac monitor to evaluate for arrhythmias    MEDICATIONS ORDERED IN ED: Medications  ketorolac (TORADOL) 30 MG/ML injection 15 mg (0 mg Intravenous Hold 12/09/23 0414)  sodium chloride 0.9 % bolus 1,000 mL (0 mLs Intravenous Stopped 12/09/23 0634)  ondansetron (ZOFRAN) injection 4 mg (4 mg Intravenous Given 12/09/23 0413)  iohexol (OMNIPAQUE) 350 MG/ML injection 100 mL (100 mLs Intravenous Contrast Given 12/09/23 0342)     IMPRESSION / MDM / ASSESSMENT AND PLAN / ED COURSE  I reviewed the triage vital signs and the nursing notes.                             42 year old male presenting with an 8-day history of right flank/abdominal pain. Differential diagnosis includes, but is not limited to, acute appendicitis, renal colic, testicular torsion, urinary tract infection/pyelonephritis, prostatitis,  epididymitis, diverticulitis, small bowel obstruction or  ileus, colitis, abdominal aortic aneurysm, gastroenteritis, hernia, etc. I personally reviewed patient's records and note colonoscopy on 10/17/2023.  Patient's presentation is most consistent with acute complicated illness / injury requiring diagnostic workup.  The patient is on the cardiac monitor to evaluate for evidence of arrhythmia and/or significant  heart rate changes.  Laboratory results demonstrate mild leukocytosis with WBC 11.5, heme concentration with hemoglobin 17.5, mild AKI with creatinine 1.31, UA negative for infection, 6-10 RBC.  History suspicious for kidney stone; however, with right lower quadrant abdominal pain, will obtain CT abdomen/pelvis with contrast to evaluate for appendicitis as well.  Will start IV fluid hydration, IV morphine for pain, IV Zofran for nausea.  Will reassess.  Clinical Course as of 12/09/23 0703  Tue Dec 09, 2023  0430 Pain improved.  Updated patient and spouse on CT scan recommending ultrasound for follow-up complex renal cyst. [JS]  [phone] Updated patient and spouse on ultrasound concerning for renal neoplasm.  Will prescribe as needed Norco and patient will follow-up with urology this week.  Strict return precautions given.  Both verbalized understanding and agree with plan of care. [JS]    Clinical Course User Index [JS] Irean Hong, MD     FINAL CLINICAL IMPRESSION(S) / ED DIAGNOSES   Final diagnoses:  Abdominal pain, unspecified abdominal location  Renal cyst  Flank pain  Renal neoplasm     Rx / DC Orders   ED Discharge Orders          Ordered    HYDROcodone-acetaminophen (NORCO) 5-325 MG tablet  Every 6 hours PRN        12/09/23 3244             Note:  This document was prepared using Dragon voice recognition software and may include unintentional dictation errors.   Irean Hong, MD 12/09/23 [phone]

## 2023-12-22 ENCOUNTER — Other Ambulatory Visit
Admission: RE | Admit: 2023-12-22 | Discharge: 2023-12-22 | Disposition: A | Discharge status: Home / Self Care | Payer: No Typology Code available for payment source | Attending: Plastic Surgery | Admitting: Plastic Surgery

## 2023-12-22 DIAGNOSIS — Z01812 Encounter for preprocedural laboratory examination: Secondary | ICD-10-CM | POA: Insufficient documentation

## 2023-12-22 LAB — CBC WITH DIFFERENTIAL/PLATELET
Abs Immature Granulocytes: 0.02 10*3/uL (ref 0.00–0.07)
Basophils Absolute: 0 10*3/uL (ref 0.0–0.1)
Basophils Relative: 0 %
Eosinophils Absolute: 0.2 10*3/uL (ref 0.0–0.5)
Eosinophils Relative: 2 %
HCT: 54.3 % — ABNORMAL HIGH (ref 39.0–52.0)
Hemoglobin: 17.9 g/dL — ABNORMAL HIGH (ref 13.0–17.0)
Immature Granulocytes: 0 %
Lymphocytes Relative: 31 %
Lymphs Abs: 2.9 10*3/uL (ref 0.7–4.0)
MCH: 28.7 pg (ref 26.0–34.0)
MCHC: 33 g/dL (ref 30.0–36.0)
MCV: 87 fL (ref 80.0–100.0)
Monocytes Absolute: 0.9 10*3/uL (ref 0.1–1.0)
Monocytes Relative: 10 %
Neutro Abs: 5.3 10*3/uL (ref 1.7–7.7)
Neutrophils Relative %: 57 %
Platelets: 298 10*3/uL (ref 150–400)
RBC: 6.24 MIL/uL — ABNORMAL HIGH (ref 4.22–5.81)
RDW: 13.2 % (ref 11.5–15.5)
WBC: 9.4 10*3/uL (ref 4.0–10.5)
nRBC: 0 % (ref 0.0–0.2)

## 2023-12-22 LAB — COMPREHENSIVE METABOLIC PANEL
ALT: 107 U/L — ABNORMAL HIGH (ref 0–44)
AST: 57 U/L — ABNORMAL HIGH (ref 15–41)
Albumin: 4.5 g/dL (ref 3.5–5.0)
Alkaline Phosphatase: 77 U/L (ref 38–126)
Anion gap: 8 (ref 5–15)
BUN: 18 mg/dL (ref 6–20)
CO2: 25 mmol/L (ref 22–32)
Calcium: 9.2 mg/dL (ref 8.9–10.3)
Chloride: 104 mmol/L (ref 98–111)
Creatinine, Ser: 1.13 mg/dL (ref 0.61–1.24)
GFR, Estimated: >60 mL/min (ref >60)
Glucose, Bld: 105 mg/dL — ABNORMAL HIGH (ref 70–99)
Potassium: 4 mmol/L (ref 3.5–5.1)
Sodium: 137 mmol/L (ref 135–145)
Total Bilirubin: 0.8 mg/dL (ref 0.0–1.2)
Total Protein: 7.9 g/dL (ref 6.5–8.1)

## 2023-12-22 LAB — TSH: TSH: 2.963 u[IU]/mL (ref 0.350–4.500)

## 2023-12-23 LAB — HEMOGLOBIN A1C
Hgb A1c MFr Bld: 5.8 % — ABNORMAL HIGH (ref 4.8–5.6)
Mean Plasma Glucose: 120 mg/dL

## 2023-12-25 ENCOUNTER — Ambulatory Visit (INDEPENDENT_AMBULATORY_CARE_PROVIDER_SITE_OTHER): Payer: No Typology Code available for payment source | Admitting: Urology

## 2023-12-25 VITALS — BP 163/87 | HR 92 | Ht 74.0 in | Wt 314.1 lb

## 2023-12-25 DIAGNOSIS — R109 Unspecified abdominal pain: Secondary | ICD-10-CM | POA: Diagnosis not present

## 2023-12-25 DIAGNOSIS — R3129 Other microscopic hematuria: Secondary | ICD-10-CM | POA: Diagnosis not present

## 2023-12-25 DIAGNOSIS — N2889 Other specified disorders of kidney and ureter: Secondary | ICD-10-CM | POA: Diagnosis not present

## 2023-12-25 DIAGNOSIS — N281 Cyst of kidney, acquired: Secondary | ICD-10-CM

## 2023-12-25 LAB — URINALYSIS, COMPLETE
Bilirubin, UA: NEGATIVE
Glucose, UA: NEGATIVE
Ketones, UA: NEGATIVE
Leukocytes,UA: NEGATIVE
Nitrite, UA: NEGATIVE
Protein,UA: NEGATIVE
RBC, UA: NEGATIVE
Specific Gravity, UA: 1.025 (ref 1.005–1.030)
Urobilinogen, Ur: 0.2 mg/dL (ref 0.2–1.0)
pH, UA: 6 (ref 5.0–7.5)

## 2023-12-25 LAB — MICROSCOPIC EXAMINATION

## 2023-12-25 NOTE — Progress Notes (Signed)
 LILLETTE Venetia PEDLAR Plume,acting as a scribe for Rosina Riis, MD.,have documented all relevant documentation on the behalf of Rosina Riis, MD,as directed by  Rosina Riis, MD while in the presence of Rosina Riis, MD.  12/25/23 10:09 AM   Stanley Davis 1981-02-23 [id number]  Referring provider: Trudy Dorn Lunger, MD No address on file  Chief Complaint  Patient presents with   Establish Care   Flank Pain    Follow up     HPI: 43 year-old male who presents today for a follow up of an ER visit.   He was seen on Christmas Eve with right lower quadrant pain radiating to the back.that had been occurring for 8 days prior. He underwent a CT abdomen pelvis with contrast for further evaluation of this. He was found to have a complex cystic renal mass measuring 2.1 by 3.7 by 2.6 cm, which is primarily cystic but also somewhat heterogenous. He also had a renal ultrasound that showed thick septations and internal soft tissue with nodularity which was suspicious for a cystic neoplasm. I personally reviewed this study. His last cross-sectional imaging for comparison was in 2017, which I also reviewed. The renal cyst was, in fact, preset at the time in a similar location, albeit much smaller and less complex in appearance, measuring 1.7 cm in largest diameter.   In the emergency room, he did have a urinalysis, which showed 6-10 RBC per high powered field. There was no evidence of infection, although he did have borderline leukocytosis at the time of 11.5, which has since resolved.   His urinalysis today is negative.   Today, he reports taking ibuprofen  and other medications during the episode. He has never smoked and is not currently experiencing any symptoms.   PMH: Past Medical History:  Diagnosis Date   Anxiety    Mild sleep apnea    Morbid obesity (HCC)     Surgical History: Past Surgical History:  Procedure Laterality Date   COLONOSCOPY WITH PROPOFOL  N/A 10/17/2023    Procedure: COLONOSCOPY WITH PROPOFOL ;  Surgeon: Therisa Bi, MD;  Location: [facility] ENDOSCOPY;  Service: Gastroenterology;  Laterality: N/A;   HERNIA REPAIR     AT AGE 6 and age 70    POLYPECTOMY  10/17/2023   Procedure: POLYPECTOMY;  Surgeon: Therisa Bi, MD;  Location: [facility] ENDOSCOPY;  Service: Gastroenterology;;   VASECTOMY      Home Medications:  Allergies as of 12/25/2023   No Known Allergies      Medication List        Accurate as of December 25, 2023 10:09 AM. If you have any questions, ask your nurse or doctor.          STOP taking these medications    HYDROcodone -acetaminophen  5-325 MG tablet Commonly known as: Norco Stopped by: Rosina Riis       TAKE these medications    losartan 50 MG tablet Commonly known as: COZAAR 2 tablets once daily   testosterone cypionate 200 MG/ML injection Commonly known as: DEPOTESTOSTERONE CYPIONATE every 28 (twenty-eight) days.        Family History: Family History  Problem Relation Age of Onset   Liver cancer Mother    Colon cancer Mother    Healthy Sister    Asthma Brother    Bipolar disorder Brother    Healthy Brother    Diabetes Father    Colon cancer Maternal Grandfather    Prostate cancer Maternal Grandfather    Kidney cancer Neg Hx  Bladder Cancer Neg Hx     Social History:  reports that he has never smoked. He has never used smokeless tobacco. He reports that he does not drink alcohol and does not use drugs.   Physical Exam: BP (!) 163/87   Pulse 92   Ht 6' 2 (1.88 m)   Wt (!) 314 lb 2 oz (142.5 kg)   BMI 40.33 kg/m   Constitutional:  Alert and oriented, No acute distress. HEENT: [place] AT, moist mucus membranes.  Trachea midline, no masses. Cardiovascular: No clubbing, cyanosis, or edema. Respiratory: Normal respiratory effort, no increased work of breathing. GI: Abdomen is soft, nontender, nondistended, no abdominal masses GU: No CVA tenderness Skin: No rashes, bruises or suspicious  lesions. Neurologic: Grossly intact, no focal deficits, moving all 4 extremities. Psychiatric: Normal mood and affect.   Pertinent Imaging: EXAM: RENAL / URINARY TRACT ULTRASOUND COMPLETE  COMPARISON:  No prior abdominal ultrasound. CT of the abdomen and pelvis 12/09/2023.  FINDINGS: Right Kidney:  Renal measurements: 12.6 x 7.7 x 6.6 cm = volume: 334.4 mL. Echogenicity within normal limits. Complex lesion in the central aspect of the lower pole measuring 2.5 x 3.5 x 2.3 cm demonstrating heterogeneous internal echogenicity with what appears to be thick internal septations and/or internal soft tissue nodularity. Other smaller lesions are also noted generally anechoic with increased through transmission, measuring up to 1.8 x 1.5 x 2.3 cm in the upper pole, compatible with cysts.  Left Kidney:  Renal measurements: 13.3 x 6.2 x 6.5 cm = volume: 278.9 mL. Echogenicity within normal limits. No mass or hydronephrosis visualized.  Bladder:  Appears normal for degree of bladder distention.  Other:  None.  IMPRESSION: 1. The lesion of concern in the lower pole of the right kidney has imaging characteristics concerning for probable cystic renal neoplasm. This could be definitively characterized with follow-up nonemergent outpatient abdominal MRI with and without IV gadolinium. Outpatient referral to Urology for further clinical evaluation is also recommended.   Electronically Signed By: Toribio Aye M.D. On: 12/09/2023 06:17  This was personally reviewed and I agree with the radiologic interpretation.   EXAM: CT ABDOMEN AND PELVIS WITH CONTRAST   TECHNIQUE: Multidetector CT imaging of the abdomen and pelvis was performed using the standard protocol following bolus administration of intravenous contrast.   RADIATION DOSE REDUCTION: This exam was performed according to the departmental dose-optimization program which includes automated exposure control,  adjustment of the mA and/or kV according to patient size and/or use of iterative reconstruction technique.   CONTRAST:  OMNIPAQUE  IOHEXOL  350 MG/ML SOLN   COMPARISON:  April 02, 2016   FINDINGS: Lower chest: No acute abnormality.   Hepatobiliary: No focal liver abnormality is seen. No gallstones, gallbladder wall thickening, or biliary dilatation.   Pancreas: Unremarkable. No pancreatic ductal dilatation or surrounding inflammatory changes.   Spleen: Normal in size without focal abnormality.   Adrenals/Urinary Tract: Adrenal glands are unremarkable. Kidneys are normal in size, without renal calculi or hydronephrosis. A 2.1 cm x 3.7 cm x 2.6 cm complex appearing cystic structure (approximately 11.42 Hounsfield units) is seen within the mid right kidney (measured 1.1 cm x 1.8 cm x 1.0 cm on the prior exam). A trace amount of perinephric inflammatory fat stranding is seen this level of the right kidney. A stable 0.8 cm diameter simple cyst is seen within the upper pole of the right kidney. Bladder is unremarkable.   Stomach/Bowel: Stomach is within normal limits. Appendix appears normal. No evidence of  bowel wall thickening, distention, or inflammatory changes.   Vascular/Lymphatic: No significant vascular findings are present. No enlarged abdominal or pelvic lymph nodes.   Reproductive: Prostate is unremarkable.   Other: No abdominal wall hernia or abnormality. No abdominopelvic ascites.   Musculoskeletal: No acute or significant osseous findings.   IMPRESSION: 1. Enlarging complex appearing cystic structure within the mid right kidney, as described above. An infected renal cyst cannot be excluded. Correlation with urinalysis and nonemergent renal ultrasound is recommended. 2. Stable simple cyst within the upper pole of the right kidney. 3. Normal appendix.     Electronically Signed   By: Suzen Dials M.D.   On: 12/09/2023 03:59    This was personally  reviewed and I agree with the radiologic interpretation.      EXAM:    CT ABDOMEN AND PELVIS WITH CONTRAST      TECHNIQUE:    Multidetector CT imaging of the abdomen and pelvis was performed    using the standard protocol following bolus administration of    intravenous contrast. Oral contrast was also administered.      CONTRAST:  OMNIPAQUE  IOHEXOL  350 MG/ML SOLN      COMPARISON:  None.     FINDINGS:   Lower chest:  Lung bases are clear.      Hepatobiliary: There is a degree of hepatic steatosis. No focal    liver lesions are identified. The gallbladder wall is not    appreciably thickened. There is no biliary duct dilatation.      Pancreas: There is no pancreatic mass or inflammatory focus.      Spleen: No splenic lesions are evident.      Adrenals/Urinary Tract: Adrenals appear normal bilaterally. Kidneys    bilaterally show no mass or hydronephrosis on either side. There is    no renal or ureteral calculus on either side. The urinary bladder is    midline with wall thickness within normal limits.      Stomach/Bowel: There is no bowel wall or mesenteric thickening. No    bowel obstruction. No free air or portal venous air.      Vascular/Lymphatic: There is no abdominal aortic aneurysm. No    vascular lesions are evident. There is no demonstrable adenopathy in    the abdomen or pelvis.      Reproductive: Prostate and seminal vesicles appear normal. There is    no pelvic mass or pelvic fluid collection.      Other: Appendix appears normal. No ascites or abscess is evident in    the abdomen or pelvis. There is a minimal ventral hernia containing    only fat.      Musculoskeletal: There are no blastic or lytic bone lesions. There    is no intramuscular or abdominal wall lesion.      IMPRESSION:    No bowel obstruction.  No abscess.  Appendix appears normal.      No renal or ureteral calculus.  No hydronephrosis.      There is a degree of hepatic steatosis.  There is a minimal ventral    hernia containing only fat.        Electronically Signed   By: Elsie Repine III M.D.   On: 04/02/2016 11:47    This was personally reviewed and I agree with the radiologic interpretation.     Assessment & Plan:    1. Right renal mass - Suspicious for cystic malignancy - MRI of the abdomen with and without contrast  for further characterization  2. Microscopic hematuria - Initial urinalysis in the ER showed 6-10 RBCs, but current urinalysis is negative for blood. - Shared decision-making with the patient to abstain from cystoscopy at this time due to low risk of bladder cancer and resolution of hematuria. Re-evaluate if hematuria recurs.  Return in about 6 weeks (around 02/05/2024) for review of MRI results and further evaluation of renal mass.  I have reviewed the above documentation for accuracy and completeness, and I agree with the above.   Rosina Riis, MD    [facility] [address], [address] [place], [place] [address] [phone]

## 2024-01-05 ENCOUNTER — Ambulatory Visit
Admission: RE | Admit: 2024-01-05 | Discharge: 2024-01-05 | Disposition: A | Discharge status: Home / Self Care | Payer: No Typology Code available for payment source | Source: Ambulatory Visit | Attending: Urology | Admitting: Urology

## 2024-01-05 DIAGNOSIS — N281 Cyst of kidney, acquired: Secondary | ICD-10-CM | POA: Diagnosis present

## 2024-01-05 DIAGNOSIS — R3129 Other microscopic hematuria: Secondary | ICD-10-CM | POA: Diagnosis present

## 2024-01-05 MED ORDER — GADOBUTROL 1 MMOL/ML IV SOLN
10.0000 mL | Freq: Once | INTRAVENOUS | Status: AC | PRN
  Administered 2024-01-05: 10 mL via INTRAVENOUS

## 2024-01-12 ENCOUNTER — Ambulatory Visit: Payer: Self-pay | Admitting: Nurse Practitioner

## 2024-01-12 ENCOUNTER — Encounter: Payer: Self-pay | Admitting: Nurse Practitioner

## 2024-01-12 DIAGNOSIS — Z113 Encounter for screening for infections with a predominantly sexual mode of transmission: Secondary | ICD-10-CM

## 2024-01-12 LAB — HM HIV SCREENING LAB: HM HIV Screening: NEGATIVE

## 2024-01-12 LAB — HM HEPATITIS C SCREENING LAB: HM Hepatitis Screen: NEGATIVE

## 2024-01-12 LAB — HEPATITIS B SURFACE ANTIGEN: Hepatitis B Surface Ag: NONREACTIVE

## 2024-01-12 NOTE — Progress Notes (Signed)
[facility] Department STI clinic [address] [place] [address] Main phone: [phone]  STI screening visit  Subjective:  FORNEY KLEINPETER is a 43 y.o. male being seen today for an STI screening visit. The patient reports they do not have symptoms.    Patient has the following medical conditions:  Patient Active Problem List   Diagnosis Date Noted   Family history of colon cancer 10/17/2023   Adenomatous polyp of colon 10/17/2023   Acute hypoxemic respiratory failure due to severe acute respiratory syndrome coronavirus 2 (SARS-CoV-2) disease (HCC) 04/06/2020   Morbid obesity with BMI of 40.0-44.9, adult (HCC) 04/06/2020   Pneumonia due to COVID-19 virus 04/06/2020   Morbid obesity (HCC)    Anxiety disorder 05/23/2010   Family history of cancer of digestive system 03/20/2009    Chief Complaint  Patient presents with   SEXUALLY TRANSMITTED DISEASE    Pt is here for STD screening and have no symptoms     Patient is a pleasant 43 y.o. male who presents to the clinic today requesting asymptomatic STI screening. 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 He states that when he sat down he realized someone had ejaculated in the porta-john and he came into contact with the substance. He is wanting testing to be safe.  Patient reports 1 male partner for the last 17 years. He practices oral and penile/vaginal penetrative sex. He reports that until this incident he had not used condoms, but since the incident he has every time he has sex.    Last HIV test per patient/review of record was No results found for: "HMHIVSCREEN" No results found for: "HIV"  Last HEPC test per patient/review of record was No results found for: "HMHEPCSCREEN" No components found for: "HEPC"   Last HEPB test per patient/review of record was No components found for: "HMHEPBSCREEN" No components found for:  "HEPC"   Does the patient or their partner desires a pregnancy in the next year? No  Screening for MPX risk: Does the patient have an unexplained rash? No Is the patient MSM? No Does the patient endorse multiple sex partners or anonymous sex partners? No Did the patient have close or sexual contact with a person diagnosed with MPX? No Has the patient traveled outside the [place] where MPX is endemic? No Is there a high clinical suspicion for MPX-- evidenced by one of the following No  -Unlikely to be chickenpox  -Lymphadenopathy  -Rash that present in same phase of evolution on any given body part   See flowsheet for further details and programmatic requirements.   Immunization History  Administered Date(s) Administered   Influenza Inj Mdck Quad Pf 09/30/2018   Influenza,inj,Quad PF,6+ Mos 09/13/2014   Influenza-Unspecified 09/16/2015   MMR 12/27/2010   Tdap 12/27/2010     The following portions of the patient's history were reviewed and updated as appropriate: allergies, current medications, past medical history, past social history, past surgical history and problem list.  Objective:  There were no vitals filed for this visit.  Physical Exam Nursing note reviewed.  Constitutional:      Appearance: Normal appearance.  HENT:     Head: Normocephalic.     Salivary Glands: Right salivary gland is not diffusely enlarged or tender. Left salivary gland is not diffusely enlarged or tender.     Mouth/Throat:     Lips: Pink. No lesions.  Mouth: Mucous membranes are moist. No oral lesions.     Tongue: No lesions. Tongue does not deviate from midline.     Pharynx: Oropharynx is clear. Uvula midline. No oropharyngeal exudate or posterior oropharyngeal erythema.     Tonsils: No tonsillar exudate.  Eyes:     General:        Right eye: No discharge.        Left eye: No discharge.     Conjunctiva/sclera:     Right eye: Right conjunctiva is not injected. No exudate.    Left eye: Left  conjunctiva is not injected. No exudate. Pulmonary:     Effort: Pulmonary effort is normal.  Genitourinary:    Comments: Patient asymptomatic and declines genital exam.  Lymphadenopathy:     Head:     Right side of head: No submental, submandibular, tonsillar, preauricular or posterior auricular adenopathy.     Left side of head: No submental, submandibular, tonsillar, preauricular or posterior auricular adenopathy.     Cervical: No cervical adenopathy.     Right cervical: No superficial or posterior cervical adenopathy.    Left cervical: No superficial or posterior cervical adenopathy.     Upper Body:     Right upper body: No supraclavicular or axillary adenopathy.     Left upper body: No supraclavicular or axillary adenopathy.  Skin:    General: Skin is warm and dry.     Findings: No lesion or rash.     Comments: Skin tone appropriate for ethnicity. Assessed exposed areas and back only.   Neurological:     Mental Status: He is alert and oriented to person, place, and time.  Psychiatric:        Attention and Perception: Attention and perception normal.        Mood and Affect: Mood and affect normal.        Speech: Speech normal.        Behavior: Behavior normal. Behavior is cooperative.        Thought Content: Thought content normal.     Assessment and Plan:  DELWYN SCOGGIN is a 43 y.o. male presenting to the [facility] Department for STI screening  1. Screening for venereal disease (Primary) Patient declined oral swab today.   - HIV/HCV [place] [facility] - HBV Antigen/Antibody [facility] - Syphilis Serology, [facility] - Chlamydia/GC NAA, Confirmation   Patient does not have STI symptoms Patient accepted screenings including  urine GC/Chlamydia, and blood work for HIV/Syphilis. Patient meets criteria for HepB screening? Yes. Ordered? yes Patient meets criteria for HepC screening? Yes. Ordered? yes Recommended condom use with all sex Discussed importance of  condom use for STI prevention  Treat positive test results per standing order. Discussed time line for [facility] results and that patient will be called with positive results and encouraged patient to call if he had not heard in 2 weeks Recommended repeat testing in 3 months with positive results. Recommended returning for continued or worsening symptoms.   Return if symptoms worsen or fail to improve.  Future Appointments  Date Time Provider Department Center  02/10/2024  9:30 AM Vanna Scotland, MD [facility] None   Total time with patient 20 minutes.   Edmonia James, NP

## 2024-01-12 NOTE — Progress Notes (Signed)
Pt is here for STD screening. Condoms declined. Sonda Primes, RN.

## 2024-01-14 ENCOUNTER — Telehealth: Payer: Self-pay | Admitting: Urology

## 2024-01-14 LAB — CHLAMYDIA/GC NAA, CONFIRMATION
Chlamydia trachomatis, NAA: NEGATIVE
Neisseria gonorrhoeae, NAA: NEGATIVE

## 2024-01-14 NOTE — Telephone Encounter (Signed)
Pt saw MRI results on MyChart and wants to know if it's necessary to keep appt on 2/25, or if it's something can be done over the phone.

## 2024-01-14 NOTE — Telephone Encounter (Signed)
Based on the results does patient need to be worked in sooner or ok to keep appointment as scheduled?

## 2024-01-19 NOTE — Telephone Encounter (Signed)
Patient advised and appointment cancelled

## 2024-01-19 NOTE — Telephone Encounter (Signed)
The MRI is consistent with a benign cyst and I agree, needs no further workup.  This is awesome news.  Okay to cancel follow-up.  Vanna Scotland, MD

## 2024-02-10 ENCOUNTER — Ambulatory Visit: Payer: Self-pay | Admitting: Urology
